# Patient Record
Sex: Female | Born: 1995
Health system: Southern US, Community
[De-identification: ages and names within clinical notes are randomized; demographics above are authoritative.]

## PROBLEM LIST (undated history)

## (undated) DIAGNOSIS — B009 Herpesviral infection, unspecified: Secondary | ICD-10-CM

## (undated) DIAGNOSIS — F411 Generalized anxiety disorder: Secondary | ICD-10-CM

## (undated) DIAGNOSIS — J45909 Unspecified asthma, uncomplicated: Secondary | ICD-10-CM

## (undated) DIAGNOSIS — D649 Anemia, unspecified: Secondary | ICD-10-CM

## (undated) DIAGNOSIS — A630 Anogenital (venereal) warts: Secondary | ICD-10-CM

## (undated) HISTORY — DX: Generalized anxiety disorder: F41.1

## (undated) HISTORY — DX: Anogenital (venereal) warts: A63.0

## (undated) HISTORY — DX: Herpesviral infection, unspecified: B00.9

## (undated) HISTORY — DX: Unspecified asthma, uncomplicated: J45.909

## (undated) HISTORY — DX: Anemia, unspecified: D64.9

---
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
## 2014-01-07 ENCOUNTER — Telehealth (INDEPENDENT_AMBULATORY_CARE_PROVIDER_SITE_OTHER): Payer: Self-pay | Admitting: General Surgery

## 2014-01-07 NOTE — Telephone Encounter (Signed)
I called to discuss her pathology report with her. There was no answer but I was able to leave a message only and straight machine to give me a call tomorrow to discuss this. I will try to contact her tomorrow as well.  Mallory MouldHaywood M. Derrell LollingIngram, M.D., [facility] General and Minimally invasive Surgery Breast and Colorectal Surgery Office:   [phone] Pager:   [phone]

## 2014-01-08 ENCOUNTER — Telehealth (INDEPENDENT_AMBULATORY_CARE_PROVIDER_SITE_OTHER): Payer: Self-pay

## 2014-01-08 ENCOUNTER — Telehealth (INDEPENDENT_AMBULATORY_CARE_PROVIDER_SITE_OTHER): Payer: Self-pay | Admitting: General Surgery

## 2014-01-08 NOTE — Telephone Encounter (Signed)
I have discussed her pathology report with Dr. Laureen OchsSmir  . He has done extensive evaluation for lymphoma on her right cervical lymph node. He does not think that she has lymphoma. 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 He says there is a small chance this could be a manifestation of systemic lupus, but doesn't favor that because of the absence of several features of the lymph node. Non-Hodgkin's lymphoma was considered, but he states that there is no evidence for that.He states that if this recurs more tissue should be obtained for further evaluation.    I discussed these findings with the patient. She seemed appreciative of the information. I will discuss this with her further at her next office visit next week. I offered a second opinion.   Angelia MouldHaywood M. Derrell LollingIngram, M.D., [facility] General and Minimally invasive Surgery Breast and Colorectal Surgery Office:   [phone] Pager:   [phone]

## 2014-01-08 NOTE — Telephone Encounter (Signed)
Pt mother called returning Dr Derrell LollingIngram phone call last night. Advised I will send a note asking Dr Derrell LollingIngram to call them back sometime today with path results. Pt mother states she has class and may not be able to answer phone. She requests that if Dr Derrell LollingIngram cannot get ahold of her if he could please document in chart what the results are so triage can relay the results. Request given to Dr Derrell LollingIngram.

## 2014-01-23 ENCOUNTER — Encounter (INDEPENDENT_AMBULATORY_CARE_PROVIDER_SITE_OTHER): Payer: Self-pay | Admitting: General Surgery

## 2014-01-23 ENCOUNTER — Ambulatory Visit (INDEPENDENT_AMBULATORY_CARE_PROVIDER_SITE_OTHER): Payer: Medicaid Other | Admitting: General Surgery

## 2014-01-23 VITALS — BP 116/80 | HR 80 | Temp 97.8°F | Resp 16 | Ht 64.0 in | Wt 138.0 lb

## 2014-01-23 DIAGNOSIS — R590 Localized enlarged lymph nodes: Secondary | ICD-10-CM

## 2014-01-23 DIAGNOSIS — R599 Enlarged lymph nodes, unspecified: Secondary | ICD-10-CM

## 2014-01-23 NOTE — Patient Instructions (Signed)
Your wound looks good and your healing from your right neck lymph node biopsy without any complications.  We have reviewed your pathology report and I gave you a copy. There is no evidence of cancer. There is no evidence of lymphoma. We feel that this is a benign lymphocytic proliferation. The pathologist and I do not think anything further needs to be done.  If the lymph nodes grow back, then consideration should be given to repeat biopsy.  Let  Dr. Derrell LollingIngram know if you would like to be referred to a medical oncologist for a second opinion.  Otherwise return to see Dr. Derrell LollingIngram if necessary.

## 2014-01-23 NOTE — Progress Notes (Signed)
Patient ID: Mallory Freeman, female   DOB: [birth year], 18 y.o.   MRN: [id number] History: This young woman underwent excision of a right cervical lymph node, posterior triangle, on 12/31/2013 because it remained persistent. She had a few other smaller nodes but this was the largest lymph node. She has recovered from this without any problems. No neck or shoulder pain. No muscle weakness. Dr. Laureen OchsSmir performed an extensive lymphoma workup on this lymph node. 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 He says there is a small chance this could be a manifestation of systemic lupus, but he doesn't favor that because of the absence of several features of the lymph nodes. Non-Hodgkin's lymphoma was considered, but he stated there was no evidence for that on special stains or flow cytometry. He stated if this recurred, more tissue should be obtained. I discussed this with her and gave her and her mother a copy of the pathology report  Exam:  healthy young woman. No distress Right neck incision healing normally. No muscle weakness.  Assessment  right cervical lymphadenopathy, final histology consistent with benign lymphocytic proliferation perforation and because she's disease  Plan: I gave her a copy of the pathology report, discussed the differential diagnosis and offered a second opinion with the medical oncologist. They will think about this Consider further biopsy of tissue if lymph nodes enlarge further Otherwise nothing further needs to be done.   Angelia MouldHaywood M. Derrell LollingIngram, M.D., [facility] General and Minimally invasive Surgery Breast and Colorectal Surgery Office:   [phone] Pager:   [phone]

## 2014-01-28 ENCOUNTER — Telehealth (INDEPENDENT_AMBULATORY_CARE_PROVIDER_SITE_OTHER): Payer: Self-pay | Admitting: General Surgery

## 2014-01-28 NOTE — Telephone Encounter (Signed)
Pt. Called and states the lymph nodes seem some larger and having problem turning neck. She was seen on last Thursday by  Dr. Derrell LollingIngram.  No fever no n/v . Is not on  anything for pain.She states surgery was done for Lymph node removal on 03/16 .I advised her to try anti inflammatory and I would notify nurse and dr Derrell LollingIngram of her concern. I tried to reassure her that it takes a while for the nodes to go down.Please call her if he wants to see her soon.Mallory Freeman 04/13

## 2014-01-29 NOTE — Telephone Encounter (Signed)
Dr. Derrell LollingIngram agrees with nurses' assessment.  Pt does not need to be seen until her post op appointment in 6-8 weeks.  Pt instructed to take her pain medication if needed, or OTC Ibuprofen.  Pt understood and agreed.

## 2015-03-20 IMAGING — CT CT NECK W/ CM
4 of 5 series · 16 of 33 positions shown, 19 images · non-contrast
Comparison: none

[Series 2: axial neck · axial · 0.38mm/px · z∈[+54,+162]mm · 3 of 109 slices shown]
[im 22/109  bone]
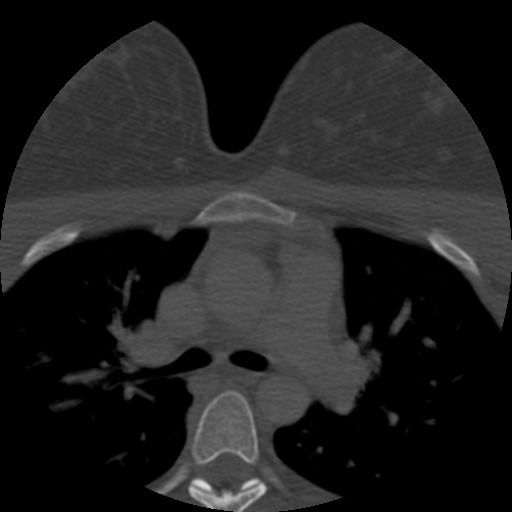
[im 44/109  bone]
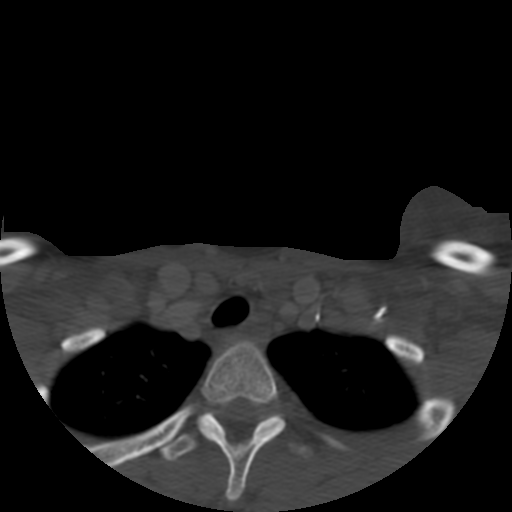
[im 65/109  bone]
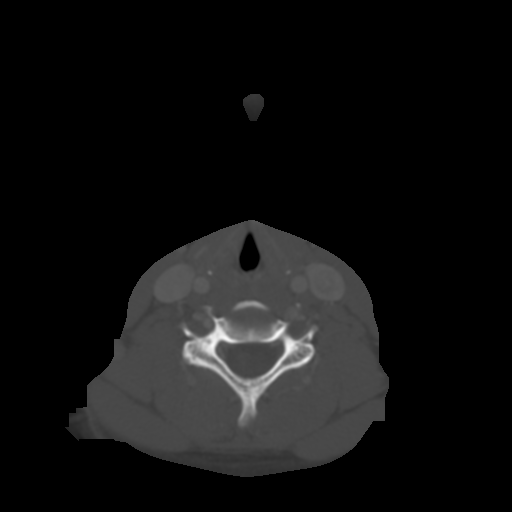

[Series 400: cor · coronal · 0.54mm/px · 3 of 106 slices shown]
[im 23/106  bone]
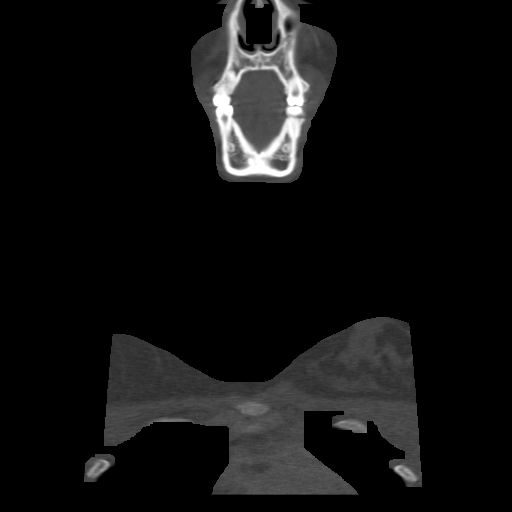
[im 43/106  bone]
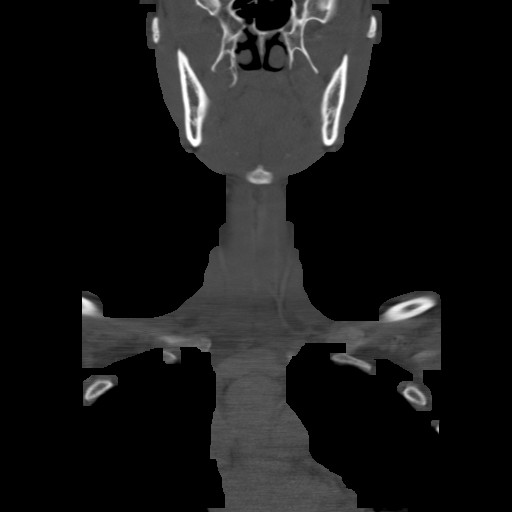
[im 63/106  bone]
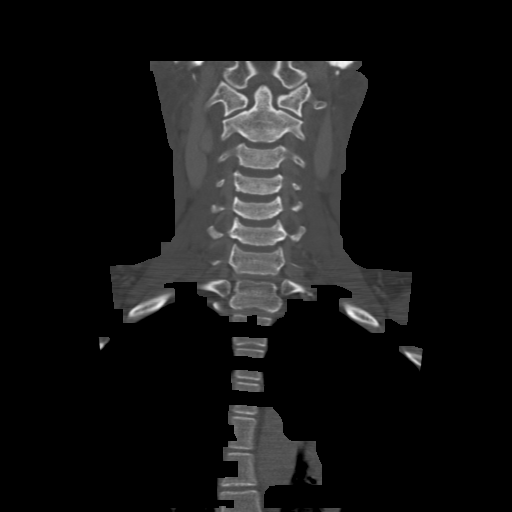

[Series 401: sag · sagittal · 0.54mm/px · 5 of 99 slices shown, 6 images]
[im 33/99  bone]
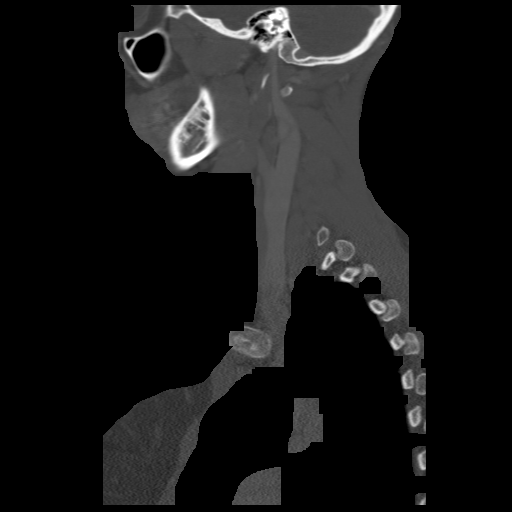
[im 41/99  bone]
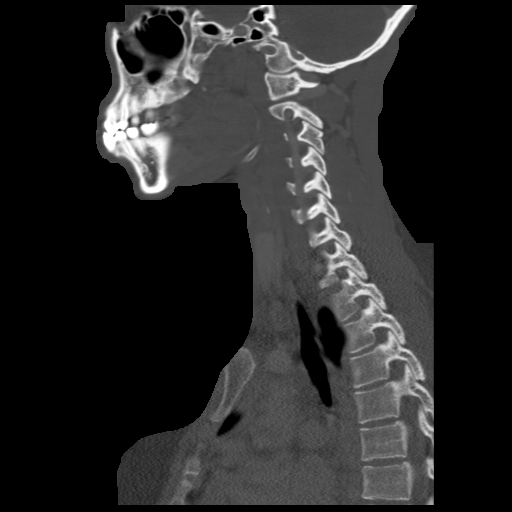
[im 50/99  soft-tissue]
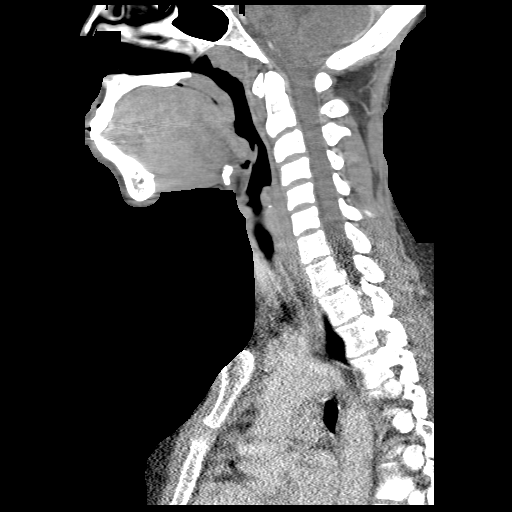
[im 50/99  bone]
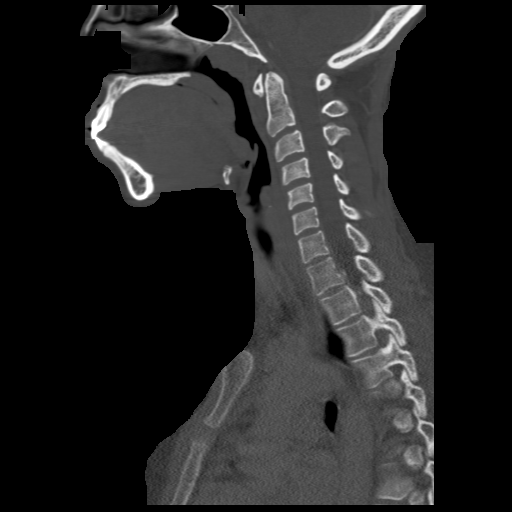
[im 58/99  bone]
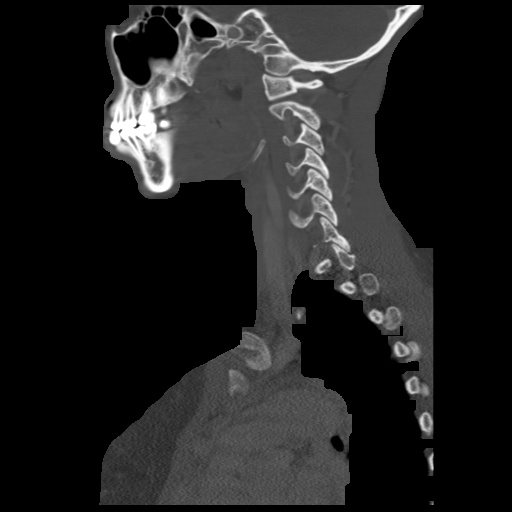
[im 66/99  bone]
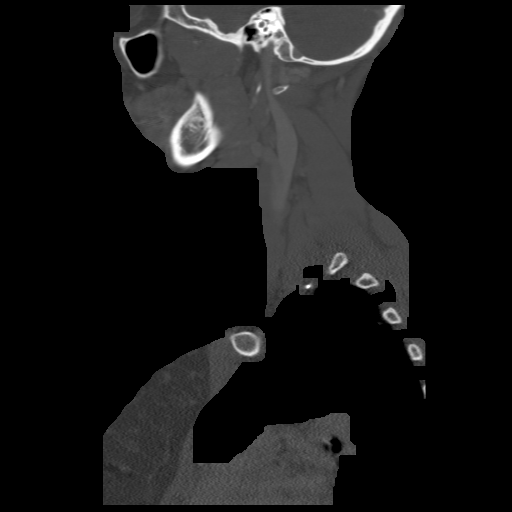

[Series 402: angled axial · axial · 0.37mm/px · z∈[+27,+208]mm · 5 of 143 slices shown, 7 images]
[im 24/143  soft-tissue]
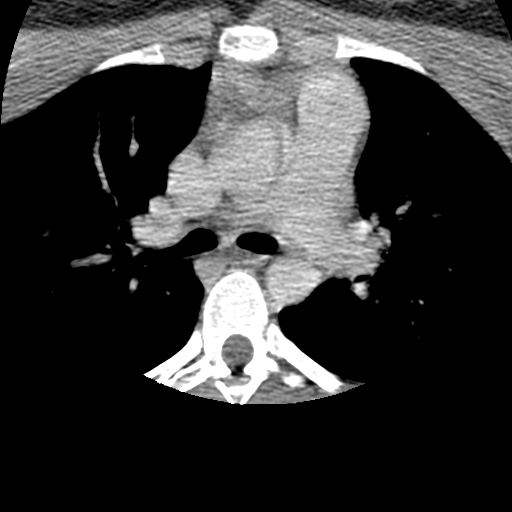
[im 24/143  bone]
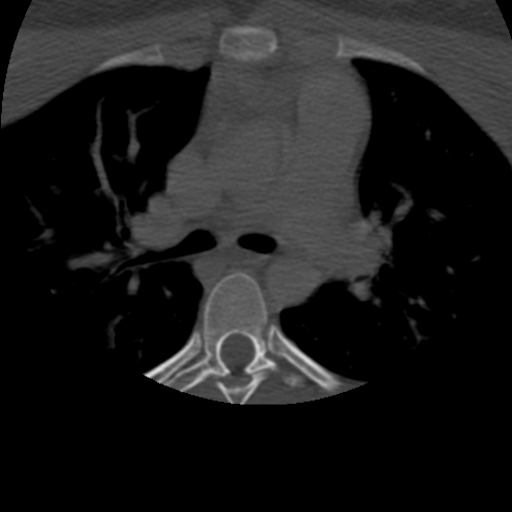
[im 48/143  bone]
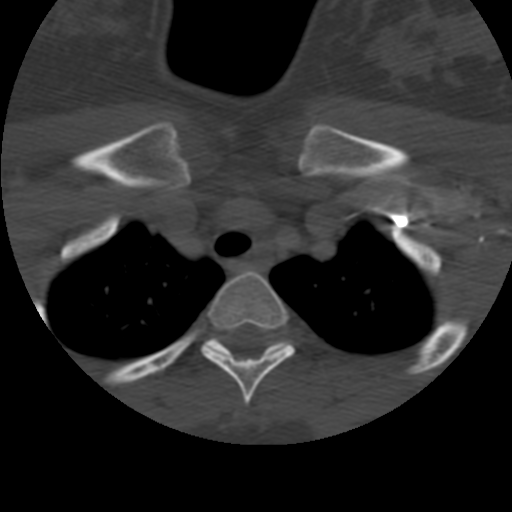
[im 72/143  bone]
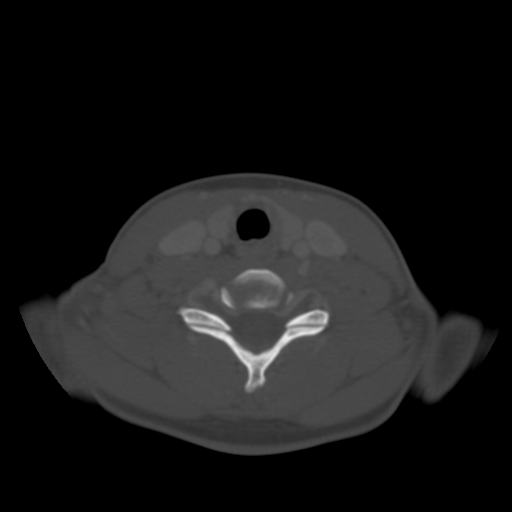
[im 95/143  bone]
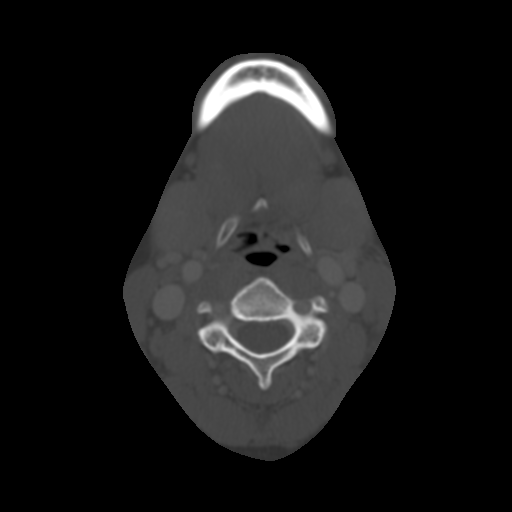
[im 119/143  soft-tissue]
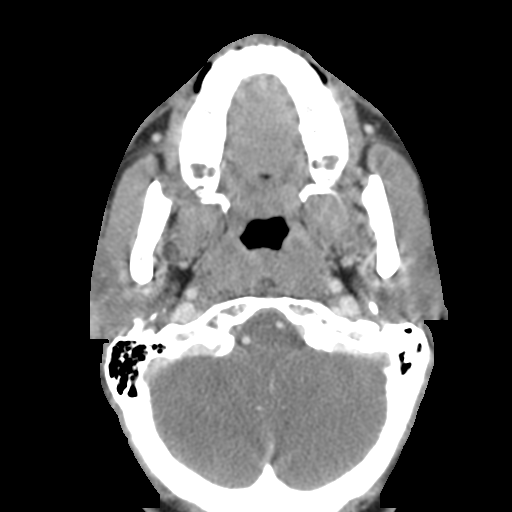
[im 119/143  bone]
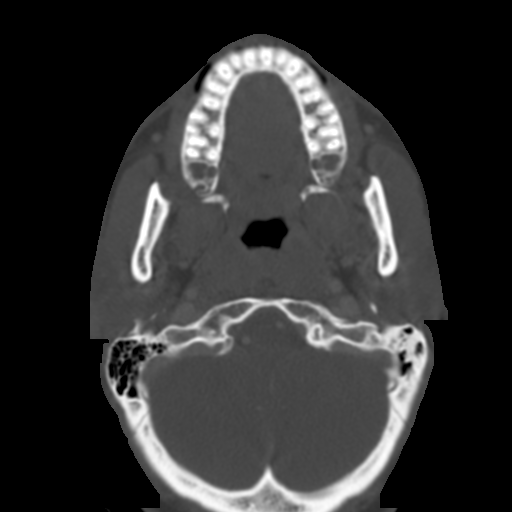

[16 of 33 positions shown; findings below may reference images not displayed]

CLINICAL DATA
Lymphadenopathy, preop.  Low-grade fever.  Symptoms for 1 month.

EXAM
CT NECK WITH CONTRAST

TECHNIQUE
Multidetector CT imaging of the neck was performed using the
standard protocol following the bolus administration of intravenous
contrast.

CONTRAST
75mL OMNIPAQUE IOHEXOL 300 MG/ML  SOLN

COMPARISON
None.

FINDINGS
The area of clinical concern is marked with a vitamin E capsule.

There is a 9 mm short axis right level VB lymph node corresponding
to the area of palpable abnormality. Right greater than left level
IIB, level IIA, and right level IV lymph nodes are also seen, none
of which exceed 10 mm short axis dimension.

Normal thyroid. No mucosal lesion. No evidence for tonsillitis or
peritonsillar inflammation. Normal major and minor salivary glands.
Unremarkable osseous structures. No upper mediastinal adenopathy is
evident. Normal lung apices. Visualized intracranial compartment
unremarkable.

IMPRESSION
The area of clinical concern corresponds to a right level VB lymph
node 9 mm short axis. See discussion above.

SIGNATURE

## 2016-02-11 DIAGNOSIS — N760 Acute vaginitis: Secondary | ICD-10-CM | POA: Diagnosis not present

## 2016-02-11 DIAGNOSIS — B373 Candidiasis of vulva and vagina: Secondary | ICD-10-CM | POA: Diagnosis not present

## 2016-02-11 DIAGNOSIS — L293 Anogenital pruritus, unspecified: Secondary | ICD-10-CM | POA: Diagnosis not present

## 2016-02-11 DIAGNOSIS — A630 Anogenital (venereal) warts: Secondary | ICD-10-CM | POA: Diagnosis not present

## 2016-02-17 DIAGNOSIS — A630 Anogenital (venereal) warts: Secondary | ICD-10-CM | POA: Diagnosis not present

## 2016-02-24 DIAGNOSIS — A630 Anogenital (venereal) warts: Secondary | ICD-10-CM | POA: Diagnosis not present

## 2016-03-08 ENCOUNTER — Emergency Department (HOSPITAL_COMMUNITY)
Admission: EM | Admit: 2016-03-08 | Discharge: 2016-03-08 | Disposition: A | Discharge status: Home / Self Care | Payer: 59 | Attending: Emergency Medicine | Admitting: Emergency Medicine

## 2016-03-08 DIAGNOSIS — J45909 Unspecified asthma, uncomplicated: Secondary | ICD-10-CM | POA: Diagnosis not present

## 2016-03-08 DIAGNOSIS — F329 Major depressive disorder, single episode, unspecified: Secondary | ICD-10-CM | POA: Insufficient documentation

## 2016-03-08 DIAGNOSIS — F10230 Alcohol dependence with withdrawal, uncomplicated: Secondary | ICD-10-CM | POA: Diagnosis not present

## 2016-03-08 DIAGNOSIS — Y903 Blood alcohol level of 60-79 mg/100 ml: Secondary | ICD-10-CM | POA: Diagnosis not present

## 2016-03-08 DIAGNOSIS — F32A Depression, unspecified: Secondary | ICD-10-CM

## 2016-03-08 DIAGNOSIS — F1024 Alcohol dependence with alcohol-induced mood disorder: Secondary | ICD-10-CM | POA: Diagnosis not present

## 2016-03-08 DIAGNOSIS — F1094 Alcohol use, unspecified with alcohol-induced mood disorder: Secondary | ICD-10-CM

## 2016-03-08 DIAGNOSIS — R45851 Suicidal ideations: Secondary | ICD-10-CM | POA: Diagnosis present

## 2016-03-08 LAB — RAPID URINE DRUG SCREEN, HOSP PERFORMED
Amphetamines: NOT DETECTED
BARBITURATES: NOT DETECTED
BENZODIAZEPINES: NOT DETECTED
COCAINE: NOT DETECTED
OPIATES: NOT DETECTED
Tetrahydrocannabinol: POSITIVE — AB

## 2016-03-08 LAB — CBC
HEMATOCRIT: 35.4 % — AB (ref 36.0–46.0)
Hemoglobin: 12.6 g/dL (ref 12.0–15.0)
MCH: 32.8 pg (ref 26.0–34.0)
MCHC: 35.6 g/dL (ref 30.0–36.0)
MCV: 92.2 fL (ref 78.0–100.0)
Platelets: 212 10*3/uL (ref 150–400)
RBC: 3.84 MIL/uL — ABNORMAL LOW (ref 3.87–5.11)
RDW: 11.8 % (ref 11.5–15.5)
WBC: 11.3 10*3/uL — AB (ref 4.0–10.5)

## 2016-03-08 LAB — COMPREHENSIVE METABOLIC PANEL
ALBUMIN: 4.5 g/dL (ref 3.5–5.0)
ALT: 25 U/L (ref 14–54)
ANION GAP: 10 (ref 5–15)
AST: 29 U/L (ref 15–41)
Alkaline Phosphatase: 41 U/L (ref 38–126)
BUN: 11 mg/dL (ref 6–20)
CO2: 21 mmol/L — AB (ref 22–32)
Calcium: 9.2 mg/dL (ref 8.9–10.3)
Chloride: 110 mmol/L (ref 101–111)
Creatinine, Ser: 0.84 mg/dL (ref 0.44–1.00)
GFR calc non Af Amer: >60 mL/min (ref >60)
GLUCOSE: 90 mg/dL (ref 65–99)
POTASSIUM: 3.5 mmol/L (ref 3.5–5.1)
SODIUM: 141 mmol/L (ref 135–145)
TOTAL PROTEIN: 7.9 g/dL (ref 6.5–8.1)
Total Bilirubin: 0.7 mg/dL (ref 0.3–1.2)

## 2016-03-08 LAB — ACETAMINOPHEN LEVEL

## 2016-03-08 LAB — ETHANOL: Alcohol, Ethyl (B): 62 mg/dL — ABNORMAL HIGH (ref <5)

## 2016-03-08 LAB — SALICYLATE LEVEL

## 2016-03-08 MED ORDER — ONDANSETRON HCL 4 MG PO TABS: 4.0000 mg | ORAL_TABLET | Freq: Three times a day (TID) | ORAL | Status: DC | PRN

## 2016-03-08 MED ORDER — ACETAMINOPHEN 325 MG PO TABS: 650.0000 mg | ORAL_TABLET | ORAL | Status: DC | PRN

## 2016-03-08 MED ORDER — LORAZEPAM 1 MG PO TABS: 1.0000 mg | ORAL_TABLET | Freq: Three times a day (TID) | ORAL | Status: DC | PRN

## 2016-03-08 NOTE — ED Notes (Signed)
Psychiatry at bedside.
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
## 2016-03-08 NOTE — ED Notes (Signed)
Bed: [facility] Expected date:  Expected time:  Means of arrival:  Comments: [employer]-IVC 
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
## 2016-03-08 NOTE — Discharge Instructions (Signed)
For your ongoing behavioral health needs, you are advised to follow up with one of the following practices for outpatient psychiatry and therapy.  Contact them at your earliest opportunity to schedule an intake appointment:       [facility]      [address]      [place], [place] [address]      [phone]       [facility]      [facility]      [address]      [place], [place] [address]      [phone]       [facility]      [address]      [place], [place] [address]      [phone]

## 2016-03-08 NOTE — BHH Suicide Risk Assessment (Signed)
Suicide Risk Assessment  Discharge Assessment   [facility]   Principal Problem: Alcohol-induced mood disorder [facility](HCC) Discharge Diagnoses:  Patient Active Problem List   Diagnosis Date Noted  . Alcohol dependence with withdrawal, uncomplicated (HCC) [F10.230] 03/08/2016    Priority: High  . Alcohol-induced mood disorder (HCC) [F10.94] 03/08/2016    Priority: High  . Lymphadenopathy of right cervical region [R59.0] 11/30/2013  . Anemia [D64.9] 09/25/2012    Total Time spent with patient: 45 minutes   Musculoskeletal: Strength & Muscle Tone: within normal limits Gait & Station: normal Patient leans: N/A  Psychiatric Specialty Exam: Physical Exam  Constitutional: She is oriented to person, place, and time. She appears well-developed and well-nourished.  HENT:  Head: Normocephalic.  Neck: Normal range of motion.  Respiratory: Effort normal.  Musculoskeletal: Normal range of motion.  Neurological: She is alert and oriented to person, place, and time.  Skin: Skin is warm and dry.    Review of Systems  Constitutional: Negative.   HENT: Negative.   Eyes: Negative.   Respiratory: Negative.   Cardiovascular: Negative.   Gastrointestinal: Negative.   Genitourinary: Negative.   Musculoskeletal: Negative.   Skin: Negative.   Neurological: Negative.   Endo/Heme/Allergies: Negative.   Psychiatric/Behavioral: Positive for substance abuse.    Blood pressure 102/71, pulse 88, temperature 98.7 F (37.1 C), temperature source Oral, resp. rate 16, height 5\' 4"  (1.626 m), weight 58.968 kg (130 lb), last menstrual period 02/07/2016, SpO2 100 %.Body mass index is 22.3 kg/(m^2).  General Appearance: Casual  Eye Contact:  Good  Speech:  Normal Rate  Volume:  Normal  Mood:  Depressed, mild  Affect:  Congruent  Thought Process:  Coherent  Orientation:  Full (Time, Place, and Person)  Thought Content:  WDL  Suicidal Thoughts:  No  Homicidal Thoughts:  No   Memory:  Immediate;   Good Recent;   Good Remote;   Good  Judgement:  Fair  Insight:  Fair  Psychomotor Activity:  Normal  Concentration:  Concentration: Good and Attention Span: Good  Recall:  Good  Fund of Knowledge:  Good  Language:  Good  Akathisia:  No  Handed:  Right  AIMS (if indicated):     Assets:  Communication Skills Desire for Improvement Financial Resources/Insurance Housing Leisure Time Physical Health Resilience Social Support Transportation Vocational/Educational  ADL's:  Intact  Cognition:  WNL  Sleep:       Mental Status Per Nursing Assessment::   On Admission:   alcohol intoxication with suicide remarks  Demographic Factors:  Adolescent or young adult  Loss Factors: NA  Historical Factors: NA  Risk Reduction Factors:   Sense of responsibility to family, Living with another person, especially a relative, Positive social support and Positive coping skills or problem solving skills  Continued Clinical Symptoms:  Depression, mild  Cognitive Features That Contribute To Risk:  None    Suicide Risk:  Minimal: No identifiable suicidal ideation.  Patients presenting with no risk factors but with morbid ruminations; may be classified as minimal risk based on the severity of the depressive symptoms    Plan Of Care/Follow-up recommendations:  Activity:  as tolerated Diet:  heart healthy diet  LORD, JAMISON, NP 03/08/2016, 1:08 PM
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
## 2016-03-08 NOTE — ED Notes (Signed)
Adolphus BirchwoodShanell Abate- mom [phone]
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
## 2016-03-10 DIAGNOSIS — H5202 Hypermetropia, left eye: Secondary | ICD-10-CM | POA: Diagnosis not present

## 2016-05-16 DIAGNOSIS — L049 Acute lymphadenitis, unspecified: Secondary | ICD-10-CM | POA: Diagnosis not present

## 2016-05-19 DIAGNOSIS — R221 Localized swelling, mass and lump, neck: Secondary | ICD-10-CM | POA: Diagnosis not present

## 2016-06-07 DIAGNOSIS — B373 Candidiasis of vulva and vagina: Secondary | ICD-10-CM | POA: Diagnosis not present

## 2016-06-07 DIAGNOSIS — N898 Other specified noninflammatory disorders of vagina: Secondary | ICD-10-CM | POA: Diagnosis not present

## 2016-06-08 DIAGNOSIS — R591 Generalized enlarged lymph nodes: Secondary | ICD-10-CM | POA: Diagnosis not present

## 2016-08-04 DIAGNOSIS — N760 Acute vaginitis: Secondary | ICD-10-CM | POA: Diagnosis not present

## 2016-08-04 DIAGNOSIS — Z202 Contact with and (suspected) exposure to infections with a predominantly sexual mode of transmission: Secondary | ICD-10-CM | POA: Diagnosis not present

## 2016-08-04 DIAGNOSIS — Z113 Encounter for screening for infections with a predominantly sexual mode of transmission: Secondary | ICD-10-CM | POA: Diagnosis not present

## 2016-08-04 DIAGNOSIS — N898 Other specified noninflammatory disorders of vagina: Secondary | ICD-10-CM | POA: Diagnosis not present

## 2016-08-05 DIAGNOSIS — Z113 Encounter for screening for infections with a predominantly sexual mode of transmission: Secondary | ICD-10-CM | POA: Diagnosis not present

## 2016-10-08 DIAGNOSIS — Z113 Encounter for screening for infections with a predominantly sexual mode of transmission: Secondary | ICD-10-CM | POA: Diagnosis not present

## 2016-10-21 DIAGNOSIS — Z1151 Encounter for screening for human papillomavirus (HPV): Secondary | ICD-10-CM | POA: Diagnosis not present

## 2016-10-21 DIAGNOSIS — Z3046 Encounter for surveillance of implantable subdermal contraceptive: Secondary | ICD-10-CM | POA: Diagnosis not present

## 2016-10-21 DIAGNOSIS — Z124 Encounter for screening for malignant neoplasm of cervix: Secondary | ICD-10-CM | POA: Diagnosis not present

## 2016-10-21 DIAGNOSIS — Z6822 Body mass index (BMI) 22.0-22.9, adult: Secondary | ICD-10-CM | POA: Diagnosis not present

## 2016-10-21 DIAGNOSIS — Z130 Encounter for screening for diseases of the blood and blood-forming organs and certain disorders involving the immune mechanism: Secondary | ICD-10-CM | POA: Diagnosis not present

## 2016-10-21 DIAGNOSIS — N921 Excessive and frequent menstruation with irregular cycle: Secondary | ICD-10-CM | POA: Diagnosis not present

## 2016-10-21 DIAGNOSIS — Z01419 Encounter for gynecological examination (general) (routine) without abnormal findings: Secondary | ICD-10-CM | POA: Diagnosis not present

## 2016-10-21 DIAGNOSIS — Z1389 Encounter for screening for other disorder: Secondary | ICD-10-CM | POA: Diagnosis not present

## 2016-10-21 DIAGNOSIS — R87610 Atypical squamous cells of undetermined significance on cytologic smear of cervix (ASC-US): Secondary | ICD-10-CM | POA: Diagnosis not present

## 2016-10-21 LAB — CBC AND DIFFERENTIAL: Hemoglobin: 13 (ref 12.0–16.0)

## 2016-11-05 ENCOUNTER — Ambulatory Visit (INDEPENDENT_AMBULATORY_CARE_PROVIDER_SITE_OTHER): Payer: 59 | Admitting: Urgent Care

## 2016-11-05 VITALS — BP 112/72 | HR 113 | Temp 98.3°F | Resp 18 | Ht 64.0 in | Wt 129.0 lb

## 2016-11-05 DIAGNOSIS — R05 Cough: Secondary | ICD-10-CM

## 2016-11-05 DIAGNOSIS — J069 Acute upper respiratory infection, unspecified: Secondary | ICD-10-CM

## 2016-11-05 DIAGNOSIS — R52 Pain, unspecified: Secondary | ICD-10-CM

## 2016-11-05 DIAGNOSIS — B9789 Other viral agents as the cause of diseases classified elsewhere: Secondary | ICD-10-CM

## 2016-11-05 DIAGNOSIS — J029 Acute pharyngitis, unspecified: Secondary | ICD-10-CM | POA: Diagnosis not present

## 2016-11-05 DIAGNOSIS — R059 Cough, unspecified: Secondary | ICD-10-CM

## 2016-11-05 LAB — POCT INFLUENZA A/B
Influenza A, POC: NEGATIVE
Influenza B, POC: NEGATIVE

## 2016-11-05 LAB — POCT RAPID STREP A (OFFICE): Rapid Strep A Screen: NEGATIVE

## 2016-11-05 MED ORDER — CETIRIZINE HCL 10 MG PO TABS
10.0000 mg | ORAL_TABLET | Freq: Every day | ORAL | 11 refills | Status: DC
Start: 2016-11-05 — End: 2017-03-24

## 2016-11-05 MED ORDER — BENZONATATE 100 MG PO CAPS: 100.0000 mg | ORAL_CAPSULE | Freq: Three times a day (TID) | ORAL | 0 refills | Status: DC | PRN

## 2016-11-05 MED ORDER — PSEUDOEPHEDRINE HCL ER 120 MG PO TB12: 120.0000 mg | ORAL_TABLET | Freq: Two times a day (BID) | ORAL | 3 refills | Status: DC

## 2016-11-05 MED ORDER — HYDROCODONE-HOMATROPINE 5-1.5 MG/5ML PO SYRP: 5.0000 mL | ORAL_SOLUTION | Freq: Every evening | ORAL | 0 refills | Status: DC | PRN

## 2016-11-05 NOTE — Patient Instructions (Signed)
Upper Respiratory Infection, Adult Most upper respiratory infections (URIs) are a viral infection of the air passages leading to the lungs. A URI affects the nose, throat, and upper air passages. The most common type of URI is nasopharyngitis and is typically referred to as "the common cold." URIs run their course and usually go away on their own. Most of the time, a URI does not require medical attention, but sometimes a bacterial infection in the upper airways can follow a viral infection. This is called a secondary infection. Sinus and middle ear infections are common types of secondary upper respiratory infections. Bacterial pneumonia can also complicate a URI. A URI can worsen asthma and chronic obstructive pulmonary disease (COPD). Sometimes, these complications can require emergency medical care and may be life threatening. What are the causes? Almost all URIs are caused by viruses. A virus is a type of germ and can spread from one person to another. What increases the risk? You may be at risk for a URI if:  You smoke.  You have chronic heart or lung disease.  You have a weakened defense (immune) system.  You are very young or very old.  You have nasal allergies or asthma.  You work in crowded or poorly ventilated areas.  You work in health care facilities or schools.  What are the signs or symptoms? Symptoms typically develop 2-3 days after you come in contact with a cold virus. Most viral URIs last 7-10 days. However, viral URIs from the influenza virus (flu virus) can last 14-18 days and are typically more severe. Symptoms may include:  Runny or stuffy (congested) nose.  Sneezing.  Cough.  Sore throat.  Headache.  Fatigue.  Fever.  Loss of appetite.  Pain in your forehead, behind your eyes, and over your cheekbones (sinus pain).  Muscle aches.  How is this diagnosed? Your health care provider may diagnose a URI by:  Physical exam.  Tests to check that your  symptoms are not due to another condition such as: ? Strep throat. ? Sinusitis. ? Pneumonia. ? Asthma.  How is this treated? A URI goes away on its own with time. It cannot be cured with medicines, but medicines may be prescribed or recommended to relieve symptoms. Medicines may help:  Reduce your fever.  Reduce your cough.  Relieve nasal congestion.  Follow these instructions at home:  Take medicines only as directed by your health care provider.  Gargle warm saltwater or take cough drops to comfort your throat as directed by your health care provider.  Use a warm mist humidifier or inhale steam from a shower to increase air moisture. This may make it easier to breathe.  Drink enough fluid to keep your urine clear or pale yellow.  Eat soups and other clear broths and maintain good nutrition.  Rest as needed.  Return to work when your temperature has returned to normal or as your health care provider advises. You may need to stay home longer to avoid infecting others. You can also use a face mask and careful hand washing to prevent spread of the virus.  Increase the usage of your inhaler if you have asthma.  Do not use any tobacco products, including cigarettes, chewing tobacco, or electronic cigarettes. If you need help quitting, ask your health care provider. How is this prevented? The best way to protect yourself from getting a cold is to practice good hygiene.  Avoid oral or hand contact with people with cold symptoms.  Wash your   hands often if contact occurs.  There is no clear evidence that vitamin C, vitamin E, echinacea, or exercise reduces the chance of developing a cold. However, it is always recommended to get plenty of rest, exercise, and practice good nutrition. Contact a health care provider if:  You are getting worse rather than better.  Your symptoms are not controlled by medicine.  You have chills.  You have worsening shortness of breath.  You have  brown or red mucus.  You have yellow or brown nasal discharge.  You have pain in your face, especially when you bend forward.  You have a fever.  You have swollen neck glands.  You have pain while swallowing.  You have white areas in the back of your throat. Get help right away if:  You have severe or persistent: ? Headache. ? Ear pain. ? Sinus pain. ? Chest pain.  You have chronic lung disease and any of the following: ? Wheezing. ? Prolonged cough. ? Coughing up blood. ? A change in your usual mucus.  You have a stiff neck.  You have changes in your: ? Vision. ? Hearing. ? Thinking. ? Mood. This information is not intended to replace advice given to you by your health care provider. Make sure you discuss any questions you have with your health care provider. Document Released: 03/30/2001 Document Revised: 06/06/2016 Document Reviewed: 01/09/2014 Elsevier Interactive Patient Education  2017 Elsevier Inc.  

## 2016-11-05 NOTE — Progress Notes (Signed)
   MRN: [id number] DOB: [birth year]  Subjective:   Mallory Freeman is a 21 y.o. female presenting for chief complaint of Cough (x5 days; very bad cough); Headache (sinus area); Nasal Congestion; and Sore Throat  Reports 5 day history of headache, sore throat, nasal congestion, body aches, worsening dry cough with chest tightness. Has also had some nausea which she typically gets when feels ill. Has tried Mucinex, NyQuil, Alka-Seltzer with some relief. Denies fever, weakness, fatigue, weight loss, sinus pain, ear pain, eye pain, chest pain, wheezing, vomiting, abdominal pain, rashes. Denies smoking cigarettes. Admits history of allergies and asthma worsened by illness. Has been using albuterol inhaler.   Mallory Freeman has a current medication list which includes the following prescription(s): etonogestrel and albuterol. Also has No Known Allergies.  Mallory Freeman  has a past medical history of Anemia and Asthma. Also  has no past surgical history on file.  Her family history includes Breast cancer in her other; Cancer in her maternal grandfather and maternal grandmother; Diabetes in her maternal grandfather; Hypertension in her mother.   Objective:   Vitals: BP 112/72   Pulse (!) 113   Temp 98.3 F (36.8 C) (Oral)   Resp 18   Ht 5\' 4"  (1.626 m)   Wt 129 lb (58.5 kg)   LMP  (LMP Unknown)   SpO2 100%   BMI 22.14 kg/m   Physical Exam  Constitutional: She is oriented to person, place, and time. She appears well-developed and well-nourished.  HENT:  TM's intact bilaterally, no effusions or erythema. Nasal turbinates pink and moist, nasal passages patent. Frontal sinus tenderness. Oropharynx with moderate post-nasal drainage, mucous membranes moist, dentition in good repair.  Eyes: Right eye exhibits no discharge. Left eye exhibits no discharge.  Neck: Normal range of motion. Neck supple.  Cardiovascular: Normal rate, regular rhythm and intact distal pulses.  Exam reveals no gallop and no  friction rub.   No murmur heard. Pulmonary/Chest: No respiratory distress. She has no wheezes. She has no rales.  Lymphadenopathy:    She has no cervical adenopathy.  Neurological: She is alert and oriented to person, place, and time.  Skin: Skin is warm and dry.   Results for orders placed or performed in visit on 11/05/16 (from the past 24 hour(s))  POCT rapid strep A     Status: None   Collection Time: 11/05/16  4:23 PM  Result Value Ref Range   Rapid Strep A Screen Negative Negative  POCT Influenza A/B     Status: None   Collection Time: 11/05/16  4:25 PM  Result Value Ref Range   Influenza A, POC Negative Negative   Influenza B, POC Negative Negative   Assessment and Plan :   1. Viral URI with cough 2. Cough 3. Sore throat 4. Body aches - Likely viral in nature, advised supportive care. - If no improvement or symptoms do not resolve return to clinic in 3-5 days  Wallis BambergMario Akela Pocius, [place] JerseyPA-C [facility] [phone] 11/05/2016  4:08 PM

## 2016-11-05 NOTE — Addendum Note (Signed)
Addended by: Baldwin CrownJOHNSON, Nasir Bright D on: 11/05/2016 05:03 PM   Modules accepted: Orders

## 2016-11-07 LAB — CULTURE, GROUP A STREP: STREP A CULTURE: NEGATIVE

## 2016-11-08 ENCOUNTER — Encounter: Payer: Self-pay | Admitting: Urgent Care

## 2016-11-15 DIAGNOSIS — R809 Proteinuria, unspecified: Secondary | ICD-10-CM | POA: Diagnosis not present

## 2017-03-11 DIAGNOSIS — N898 Other specified noninflammatory disorders of vagina: Secondary | ICD-10-CM | POA: Diagnosis not present

## 2017-03-11 DIAGNOSIS — N760 Acute vaginitis: Secondary | ICD-10-CM | POA: Diagnosis not present

## 2017-03-11 DIAGNOSIS — R809 Proteinuria, unspecified: Secondary | ICD-10-CM | POA: Diagnosis not present

## 2017-03-11 DIAGNOSIS — A630 Anogenital (venereal) warts: Secondary | ICD-10-CM | POA: Diagnosis not present

## 2017-03-23 NOTE — Progress Notes (Signed)
HPI:  Mallory Freeman is here to establish care.  Last PCP and physical: sees gynecologist for gyn exams Mallory Freeman)  Has the following chronic problems that require follow up and concerns today:  GAD: -worries all the time about everything -reports hx dx of biplar d/o or and thoughts of self harm remotely, but none recently and this happened with specific triggering events -denies depression, thoughts of harm to self or others, safety concerns, panic, manic symptoms -she does smoke pot to help but wants to quit -drinks rarely - 2-3 drinks once 1-3 times per month -denies use of any other drugs   Protein in urine: -reports had random udip at gyn visit and was told had protein in urine and to see PCP -no malaise, hematuria, dysuria, excessive exercise, hx renal dz, FH renal dz, fevers  Mild intermittent asthma: -had asthma as a child -now only has symptoms sometimes with exercise and with bad respiratory illness -uses alb prn -no symptoms currently  Contraception/Hx genital herpes/genital warts: -seeing gyn for management   ROS negative for unless reported above: fevers, unintentional weight loss, hearing or vision loss, chest pain, palpitations, struggling to breath, hemoptysis, melena, hematochezia, hematuria, falls, loc, si, thoughts of self harm  Past Medical History:  Diagnosis Date  . Anemia   . Asthma   . GAD (generalized anxiety disorder)   . Genital warts   . HSV infection     No past surgical history on file.  Family History  Problem Relation Age of Onset  . Hypertension Mother   . Cancer Maternal Grandmother        lymphoma  . Cancer Maternal Grandfather        prostate  . Diabetes Maternal Grandfather   . Breast cancer Other        Maternal Great Aunts and GreatGreat GM    Social History   Social History  . Marital status: Single    Spouse name: N/A  . Number of children: N/A  . Years of education: N/A   Social History Main Topics   . Smoking status: Never Smoker  . Smokeless tobacco: Never Used  . Alcohol use No  . Drug use: No  . Sexual activity: No   Other Topics Concern  . 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  albuterol (PROVENTIL HFA;VENTOLIN HFA) 108 (90 BASE) MCG/ACT inhaler, Inhale 2 puffs into the lungs every 6 (six) hours as needed., Disp: , Rfl:  .  etonogestrel (NEXPLANON) 68 MG IMPL implant, 1 each by Subdermal route once., Disp: , Rfl:   EXAM:  Vitals:   03/24/17 1306  BP: 100/80  Pulse: 66  Temp: 98.8 F (37.1 C)    Body mass index is 21.87 kg/m.  GENERAL: vitals reviewed and listed above, alert, oriented, appears well hydrated and in no acute distress  HEENT: atraumatic, conjunttiva clear, no obvious abnormalities on inspection of external nose and ears  NECK: no obvious masses on inspection  LUNGS: clear to auscultation bilaterally, no wheezes, rales or rhonchi, good air movement  CV: HRRR, no peripheral edema  MS: moves all extremities without noticeable abnormality  PSYCH: pleasant and cooperative, no obvious depression or anxiety  ASSESSMENT AND PLAN:  Discussed the following assessment  and plan:  Abnormal urine - Plan: Basic metabolic panel, POCT Urinalysis Dipstick (Automated)  GAD (generalized anxiety disorder)  HSV infection  Genital warts  Alcohol use  Encounter to establish care -We reviewed the PMH, PSH, FH, SH, Meds and Allergies. -We provided refills for any medications we will prescribe as needed. -We addressed current concerns per orders and patient instructions. -We have asked for records for pertinent exams, studies, vaccines and notes from previous providers. -We have advised patient to follow up per instructions below. -advise to of risks of weed and alcohol and offered  help -advised treatment for GAD and she agrees to consider CBT - return and emergency precautions discussed -labs per orders  -Patient advised to return or notify a doctor immediately if symptoms worsen or persist or new concerns arise.  Patient Instructions  BEFORE YOU LEAVE: -follow up: 3 months -lab for urine studies  We have ordered labs or studies at this visit. It can take up to 1-2 weeks for results and processing. IF results require follow up or explanation, we will call you with instructions. Clinically stable results will be released to your [facility]. If you have not heard from us or cannot find your results in [facility] in 2 weeks please contact our office at [phone].  If you are not yet signed up for [facility], please consider signing up.   Please call to schedule appointment with behavioral medicine regarding the anxiety. Follow up if worsening or new concerns.   We recommend the following healthy lifestyle for LIFE: 1) Small portions.   Tip: eat off of a salad plate instead of a dinner plate.  Tip: if you need more or a snack choose fruits, veggies and/or a handful of nuts or seeds.  2) Eat a healthy clean diet.  * Tip: Avoid (less then 1 serving per week): processed foods, sweets, sweetened drinks, white starches (rice, flour, bread, potatoes, pasta, etc), red meat, fast foods, butter  *Tip: CHOOSE instead   * 5-9 servings per day of fresh or frozen fruits and vegetables (but not corn, potatoes, bananas, canned or dried fruit)   *nuts and seeds, beans   *olives and olive oil   *small portions of lean meats such as fish and white chicken    *small portions of whole grains  3)Get at least 150 minutes of sweaty aerobic exercise per week.  4)Reduce stress - consider counseling, meditation and relaxation to balance other aspects of your life.         Kriste BasqueKIM, Nikko Goldwire R.

## 2017-03-24 ENCOUNTER — Ambulatory Visit (INDEPENDENT_AMBULATORY_CARE_PROVIDER_SITE_OTHER): Payer: 59 | Admitting: Family Medicine

## 2017-03-24 ENCOUNTER — Encounter: Payer: Self-pay | Admitting: Family Medicine

## 2017-03-24 VITALS — BP 100/80 | HR 66 | Temp 98.8°F | Ht 64.75 in | Wt 130.4 lb

## 2017-03-24 DIAGNOSIS — A630 Anogenital (venereal) warts: Secondary | ICD-10-CM | POA: Insufficient documentation

## 2017-03-24 DIAGNOSIS — Z7689 Persons encountering health services in other specified circumstances: Secondary | ICD-10-CM | POA: Diagnosis not present

## 2017-03-24 DIAGNOSIS — Z789 Other specified health status: Secondary | ICD-10-CM | POA: Insufficient documentation

## 2017-03-24 DIAGNOSIS — R8290 Unspecified abnormal findings in urine: Secondary | ICD-10-CM

## 2017-03-24 DIAGNOSIS — F411 Generalized anxiety disorder: Secondary | ICD-10-CM | POA: Diagnosis not present

## 2017-03-24 DIAGNOSIS — B009 Herpesviral infection, unspecified: Secondary | ICD-10-CM | POA: Insufficient documentation

## 2017-03-24 DIAGNOSIS — Z7289 Other problems related to lifestyle: Secondary | ICD-10-CM

## 2017-03-24 LAB — BASIC METABOLIC PANEL
BUN: 9 mg/dL (ref 6–23)
CHLORIDE: 105 meq/L (ref 96–112)
CO2: 25 mEq/L (ref 19–32)
Calcium: 10.5 mg/dL (ref 8.4–10.5)
Creatinine, Ser: 0.73 mg/dL (ref 0.40–1.20)
GFR: 128.37 mL/min (ref >60.00)
Glucose, Bld: 87 mg/dL (ref 70–99)
POTASSIUM: 4 meq/L (ref 3.5–5.1)
SODIUM: 137 meq/L (ref 135–145)

## 2017-03-24 LAB — POC URINALSYSI DIPSTICK (AUTOMATED)
Bilirubin, UA: NEGATIVE
Blood, UA: NEGATIVE
Glucose, UA: NEGATIVE
Ketones, UA: NEGATIVE
Leukocytes, UA: NEGATIVE
Nitrite, UA: NEGATIVE
Protein, UA: NEGATIVE
Spec Grav, UA: <=1.005 — AB
Urobilinogen, UA: 0.2 U/dL
pH, UA: 7.5

## 2017-03-24 NOTE — Patient Instructions (Signed)
BEFORE YOU LEAVE: -follow up: 3 months -lab for urine studies  We have ordered labs or studies at this visit. It can take up to 1-2 weeks for results and processing. IF results require follow up or explanation, we will call you with instructions. Clinically stable results will be released to your [facility]. If you have not heard from us or cannot find your results in [facility] in 2 weeks please contact our office at [phone].  If you are not yet signed up for [facility], please consider signing up.   Please call to schedule appointment with behavioral medicine regarding the anxiety. Follow up if worsening or new concerns.   We recommend the following healthy lifestyle for LIFE: 1) Small portions.   Tip: eat off of a salad plate instead of a dinner plate.  Tip: if you need more or a snack choose fruits, veggies and/or a handful of nuts or seeds.  2) Eat a healthy clean diet.  * Tip: Avoid (less then 1 serving per week): processed foods, sweets, sweetened drinks, white starches (rice, flour, bread, potatoes, pasta, etc), red meat, fast foods, butter  *Tip: CHOOSE instead   * 5-9 servings per day of fresh or frozen fruits and vegetables (but not corn, potatoes, bananas, canned or dried fruit)   *nuts and seeds, beans   *olives and olive oil   *small portions of lean meats such as fish and white chicken    *small portions of whole grains  3)Get at least 150 minutes of sweaty aerobic exercise per week.  4)Reduce stress - consider counseling, meditation and relaxation to balance other aspects of your life.

## 2017-03-25 ENCOUNTER — Encounter: Payer: Self-pay | Admitting: Family Medicine

## 2017-03-25 NOTE — Addendum Note (Signed)
Addended by: Sallee LangeFUNDERBURK, Fraida Veldman on: 03/25/2017 02:36 PM   Modules accepted: Orders

## 2017-03-28 ENCOUNTER — Encounter: Payer: Self-pay | Admitting: Family Medicine

## 2017-04-05 ENCOUNTER — Telehealth: Payer: Self-pay | Admitting: Family Medicine

## 2017-04-05 NOTE — Telephone Encounter (Signed)
° ° ° °  Pt call to say the last time she saw Dr Selena BattenKim she told her of a medicine that she could try for anxiety. She is asking if this is something that Dr Selena BattenKim will RX or if she need to see someone to have it RX.    [phone]

## 2017-04-07 NOTE — Progress Notes (Signed)
HPI:  GAD: -worries all the time about everything -reports never actually dx with bipolar but friend remotely told her that when she got very anxious in a bad situation -she now has been anxious, worried constantly as will [place] moving to [place] as was accepted to a university - she is stressed about the move and list of things to do -has occ panic symptoms -hx of some anhedonia and depressed mood -denies depression, thoughts of harm to self or others, safety concerns, panic, manic symptoms, hallucinations -she does smoke pot to help but wants to quit -drinks rarely - 2-3 drinks once 1-3 times per month -denies use of any other drugs   Protein in urine: -on dips with gyn this year -none on urine testing here -asymptomatic -advised recheck in 1 month    ROS: See pertinent positives and negatives per HPI.  Past Medical History:  Diagnosis Date  . Anemia   . Asthma   . GAD (generalized anxiety disorder)   . Genital warts   . HSV infection     No past surgical history on file.  Family History  Problem Relation Age of Onset  . Hypertension Mother   . Cancer Maternal Grandmother        lymphoma  . Cancer Maternal Grandfather        prostate  . Diabetes Maternal Grandfather   . Breast cancer Other        Maternal Great Aunts and GreatGreat GM    Social History   Social History  . Marital status: Single    Spouse name: N/A  . Number of children: N/A  . Years of education: N/A   Social History Main Topics  . Smoking status: Never Smoker  . Smokeless tobacco: Never Used  . Alcohol use No  . Drug use: No  . Sexual activity: No   Other Topics Concern  . 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  albuterol (PROVENTIL HFA;VENTOLIN HFA) 108 (90 BASE) MCG/ACT inhaler, Inhale 2 puffs into the lungs every 6  (six) hours as needed., Disp: , Rfl:  .  etonogestrel (NEXPLANON) 68 MG IMPL implant, 1 each by Subdermal route once., Disp: , Rfl:  .  PARoxetine (PAXIL) 20 MG tablet, Take 1 tablet (20 mg total) by mouth daily., Disp: 30 tablet, Rfl: 0  EXAM:  Vitals:   04/08/17 1101  BP: 98/76  Temp: 98.4 F (36.9 C)    Body mass index is 21.9 kg/m.  GENERAL: vitals reviewed and listed above, alert, oriented, appears well hydrated and in no acute distress  HEENT: atraumatic, conjunttiva clear, no obvious abnormalities on inspection of external nose and ears  NECK: no obvious masses on inspection  LUNGS: clear to auscultation bilaterally, no wheezes, rales or rhonchi, good air movement  CV: HRRR, no peripheral edema  MS: moves all extremities without noticeable abnormality  PSYCH: pleasant and cooperative, no obvious depression or anxiety  ASSESSMENT AND PLAN:  Discussed the following assessment and plan:  GAD (generalized anxiety disorder)  Alcohol use  Abnormal finding in urine  -discussed tx options and risks for management of GAD and Depression -she opted to start paxil, risks discussed at length -it now sounds like there was not a hx of bipolar /  o, and does not seem to have symptoms of this - did still discuss this and risks with SSRI -advise not using weed, alcohol or other drugs with this medication -advised CBT -she will be moving in 3-4 weeks and plans to establish with student health and counseling at school -lifestyle recs -return precautions -follow up 2 weeks - will plan to check urine again and provide further refills if doing well then  -Patient advised to return or notify a doctor immediately if symptoms worsen or persist or new concerns arise.  Patient Instructions  BEFORE YOU LEAVE: -follow up: 2 weeks  Start the Paxil once daily.  Stop alcohol and smoking.  I hope you are feeling better soon! Follow up sooner if worsening, new concerns or you are not  improving with treatment.   Paroxetine tablets What is this medicine? PAROXETINE (pa ROX e teen) is used to treat depression, anxiety disorders, obsessive compulsive disorder, panic attacks, post traumatic stress, and premenstrual dysphoric disorder (PMDD). This medicine may be used for other purposes; ask your health care provider or pharmacist if you have questions. COMMON BRAND NAME(S): Paxil, Pexeva What should I tell my health care provider before I take this medicine? They need to know if you have any of these conditions: -bipolar disorder or a family history of bipolar disorder -bleeding disorders -glaucoma -heart disease -kidney disease -liver disease -low levels of sodium in the blood -seizures -suicidal thoughts, plans, or attempt; a previous suicide attempt by you or a family member -take MAOIs like Carbex, Eldepryl, Marplan, Nardil, and Parnate -take medicines that treat or prevent blood clots -thyroid disease -an unusual or allergic reaction to paroxetine, other medicines, foods, dyes, or preservatives -pregnant or trying to get pregnant -breast-feeding How should I use this medicine? Take this medicine by mouth with a glass of water. Follow the directions on the prescription label. You can take it with or without food. Take your medicine at regular intervals. Do not take your medicine more often than directed. Do not stop taking this medicine suddenly except upon the advice of your doctor. Stopping this medicine too quickly may cause serious side effects or your condition may worsen. A special MedGuide will be given to you by the pharmacist with each prescription and refill. Be sure to read this information carefully each time. Talk to your pediatrician regarding the use of this medicine in children. Special care may be needed. Overdosage: If you think you have taken too much of this medicine contact a poison control center or emergency room at once. NOTE: This medicine is  only for you. Do not share this medicine with others. What if I miss a dose? If you miss a dose, take it as soon as you can. If it is almost time for your next dose, take only that dose. Do not take double or extra doses. What may interact with this medicine? Do not take this medicine with any of the following medications: -linezolid -MAOIs like Carbex, Eldepryl, Marplan, Nardil, and Parnate -methylene blue (injected into a vein) -pimozide -thioridazine This medicine may also interact with the following medications: -alcohol -amphetamines -aspirin and aspirin-like medicines -atomoxetine -certain medicines for depression, anxiety, or psychotic disturbances -certain medicines for irregular heart beat like propafenone, flecainide, encainide, and quinidine -certain medicines for migraine headache like almotriptan, eletriptan, frovatriptan, naratriptan, rizatriptan, sumatriptan, zolmitriptan -cimetidine -digoxin -diuretics -fentanyl -fosamprenavir -furazolidone -isoniazid -lithium -medicines that treat or prevent blood clots like warfarin, enoxaparin, and dalteparin -medicines for sleep -NSAIDs, medicines for pain  and inflammation, like ibuprofen or naproxen -phenobarbital -phenytoin -procarbazine -rasagiline -ritonavir -supplements like St. John's wort, kava kava, valerian -tamoxifen -tramadol -tryptophan This list may not describe all possible interactions. Give your health care provider a list of all the medicines, herbs, non-prescription drugs, or dietary supplements you use. Also tell them if you smoke, drink alcohol, or use illegal drugs. Some items may interact with your medicine. What should I watch for while using this medicine? Tell your doctor if your symptoms do not get better or if they get worse. Visit your doctor or health care professional for regular checks on your progress. Because it may take several weeks to see the full effects of this medicine, it is important  to continue your treatment as prescribed by your doctor. Patients and their families should watch out for new or worsening thoughts of suicide or depression. Also watch out for sudden changes in feelings such as feeling anxious, agitated, panicky, irritable, hostile, aggressive, impulsive, severely restless, overly excited and hyperactive, or not being able to sleep. If this happens, especially at the beginning of treatment or after a change in dose, call your health care professional. Bonita QuinYou may get drowsy or dizzy. Do not drive, use machinery, or do anything that needs mental alertness until you know how this medicine affects you. Do not stand or sit up quickly, especially if you are an older patient. This reduces the risk of dizzy or fainting spells. Alcohol may interfere with the effect of this medicine. Avoid alcoholic drinks. Your mouth may get dry. Chewing sugarless gum or sucking hard candy, and drinking plenty of water will help. Contact your doctor if the problem does not go away or is severe. What side effects may I notice from receiving this medicine? Side effects that you should report to your doctor or health care professional as soon as possible: -allergic reactions like skin rash, itching or hives, swelling of the face, lips, or tongue -anxious -black, tarry stools -changes in vision -confusion -elevated mood, decreased need for sleep, racing thoughts, impulsive behavior -eye pain -fast, irregular heartbeat -feeling faint or lightheaded, falls -feeling agitated, angry, or irritable -hallucination, loss of contact with reality -loss of balance or coordination -loss of memory -painful or prolonged erections -restlessness, pacing, inability to keep still -seizures -stiff muscles -suicidal thoughts or other mood changes -trouble sleeping -unusual bleeding or bruising -unusually weak or tired -vomiting Side effects that usually do not require medical attention (report to your  doctor or health care professional if they continue or are bothersome): -change in appetite or weight -change in sex drive or performance -diarrhea -dizziness -dry mouth -increased sweating -indigestion, nausea -tired -tremors This list may not describe all possible side effects. Call your doctor for medical advice about side effects. You may report side effects to FDA at 1-800-FDA-1088. Where should I keep my medicine? Keep out of the reach of children. Store at room temperature between 15 and 30 degrees C (59 and 86 degrees F). Keep container tightly closed. Throw away any unused medicine after the expiration date. NOTE: This sheet is a summary. It may not cover all possible information. If you have questions about this medicine, talk to your doctor, pharmacist, or health care provider.  2018 Elsevier/Gold Standard (2016-03-06 15:50:32)    Kriste BasqueKIM, HANNAH R., DO

## 2017-04-07 NOTE — Telephone Encounter (Signed)
Would prefer appt to discuss options and see what would work best for her. Thanks.

## 2017-04-08 ENCOUNTER — Ambulatory Visit (INDEPENDENT_AMBULATORY_CARE_PROVIDER_SITE_OTHER): Payer: 59 | Admitting: Family Medicine

## 2017-04-08 ENCOUNTER — Encounter: Payer: Self-pay | Admitting: Family Medicine

## 2017-04-08 VITALS — BP 98/76 | Temp 98.4°F | Wt 130.6 lb

## 2017-04-08 DIAGNOSIS — Z7289 Other problems related to lifestyle: Secondary | ICD-10-CM

## 2017-04-08 DIAGNOSIS — Z789 Other specified health status: Secondary | ICD-10-CM

## 2017-04-08 DIAGNOSIS — F411 Generalized anxiety disorder: Secondary | ICD-10-CM | POA: Diagnosis not present

## 2017-04-08 DIAGNOSIS — A630 Anogenital (venereal) warts: Secondary | ICD-10-CM | POA: Diagnosis not present

## 2017-04-08 DIAGNOSIS — R8290 Unspecified abnormal findings in urine: Secondary | ICD-10-CM | POA: Diagnosis not present

## 2017-04-08 MED ORDER — PAROXETINE HCL 20 MG PO TABS
20.0000 mg | ORAL_TABLET | Freq: Every day | ORAL | 0 refills | Status: DC
Start: 2017-04-08 — End: 2017-05-17

## 2017-04-08 NOTE — Telephone Encounter (Signed)
Left message on machine for patient to return our call to schedule an appointment. 

## 2017-04-08 NOTE — Patient Instructions (Signed)
BEFORE YOU LEAVE: -follow up: 2 weeks  Start the Paxil once daily.  Stop alcohol and smoking.  I hope you are feeling better soon! Follow up sooner if worsening, new concerns or you are not improving with treatment.   Paroxetine tablets What is this medicine? PAROXETINE (pa ROX e teen) is used to treat depression, anxiety disorders, obsessive compulsive disorder, panic attacks, post traumatic stress, and premenstrual dysphoric disorder (PMDD). This medicine may be used for other purposes; ask your health care provider or pharmacist if you have questions. COMMON BRAND NAME(S): Paxil, Pexeva What should I tell my health care provider before I take this medicine? They need to know if you have any of these conditions: -bipolar disorder or a family history of bipolar disorder -bleeding disorders -glaucoma -heart disease -kidney disease -liver disease -low levels of sodium in the blood -seizures -suicidal thoughts, plans, or attempt; a previous suicide attempt by you or a family member -take MAOIs like Carbex, Eldepryl, Marplan, Nardil, and Parnate -take medicines that treat or prevent blood clots -thyroid disease -an unusual or allergic reaction to paroxetine, other medicines, foods, dyes, or preservatives -pregnant or trying to get pregnant -breast-feeding How should I use this medicine? Take this medicine by mouth with a glass of water. Follow the directions on the prescription label. You can take it with or without food. Take your medicine at regular intervals. Do not take your medicine more often than directed. Do not stop taking this medicine suddenly except upon the advice of your doctor. Stopping this medicine too quickly may cause serious side effects or your condition may worsen. A special MedGuide will be given to you by the pharmacist with each prescription and refill. Be sure to read this information carefully each time. Talk to your pediatrician regarding the use of this  medicine in children. Special care may be needed. Overdosage: If you think you have taken too much of this medicine contact a poison control center or emergency room at once. NOTE: This medicine is only for you. Do not share this medicine with others. What if I miss a dose? If you miss a dose, take it as soon as you can. If it is almost time for your next dose, take only that dose. Do not take double or extra doses. What may interact with this medicine? Do not take this medicine with any of the following medications: -linezolid -MAOIs like Carbex, Eldepryl, Marplan, Nardil, and Parnate -methylene blue (injected into a vein) -pimozide -thioridazine This medicine may also interact with the following medications: -alcohol -amphetamines -aspirin and aspirin-like medicines -atomoxetine -certain medicines for depression, anxiety, or psychotic disturbances -certain medicines for irregular heart beat like propafenone, flecainide, encainide, and quinidine -certain medicines for migraine headache like almotriptan, eletriptan, frovatriptan, naratriptan, rizatriptan, sumatriptan, zolmitriptan -cimetidine -digoxin -diuretics -fentanyl -fosamprenavir -furazolidone -isoniazid -lithium -medicines that treat or prevent blood clots like warfarin, enoxaparin, and dalteparin -medicines for sleep -NSAIDs, medicines for pain and inflammation, like ibuprofen or naproxen -phenobarbital -phenytoin -procarbazine -rasagiline -ritonavir -supplements like St. John's wort, kava kava, valerian -tamoxifen -tramadol -tryptophan This list may not describe all possible interactions. Give your health care provider a list of all the medicines, herbs, non-prescription drugs, or dietary supplements you use. Also tell them if you smoke, drink alcohol, or use illegal drugs. Some items may interact with your medicine. What should I watch for while using this medicine? Tell your doctor if your symptoms do not get  better or if they get worse. Visit your doctor or  health care professional for regular checks on your progress. Because it may take several weeks to see the full effects of this medicine, it is important to continue your treatment as prescribed by your doctor. Patients and their families should watch out for new or worsening thoughts of suicide or depression. Also watch out for sudden changes in feelings such as feeling anxious, agitated, panicky, irritable, hostile, aggressive, impulsive, severely restless, overly excited and hyperactive, or not being able to sleep. If this happens, especially at the beginning of treatment or after a change in dose, call your health care professional. Bonita QuinYou may get drowsy or dizzy. Do not drive, use machinery, or do anything that needs mental alertness until you know how this medicine affects you. Do not stand or sit up quickly, especially if you are an older patient. This reduces the risk of dizzy or fainting spells. Alcohol may interfere with the effect of this medicine. Avoid alcoholic drinks. Your mouth may get dry. Chewing sugarless gum or sucking hard candy, and drinking plenty of water will help. Contact your doctor if the problem does not go away or is severe. What side effects may I notice from receiving this medicine? Side effects that you should report to your doctor or health care professional as soon as possible: -allergic reactions like skin rash, itching or hives, swelling of the face, lips, or tongue -anxious -black, tarry stools -changes in vision -confusion -elevated mood, decreased need for sleep, racing thoughts, impulsive behavior -eye pain -fast, irregular heartbeat -feeling faint or lightheaded, falls -feeling agitated, angry, or irritable -hallucination, loss of contact with reality -loss of balance or coordination -loss of memory -painful or prolonged erections -restlessness, pacing, inability to keep still -seizures -stiff  muscles -suicidal thoughts or other mood changes -trouble sleeping -unusual bleeding or bruising -unusually weak or tired -vomiting Side effects that usually do not require medical attention (report to your doctor or health care professional if they continue or are bothersome): -change in appetite or weight -change in sex drive or performance -diarrhea -dizziness -dry mouth -increased sweating -indigestion, nausea -tired -tremors This list may not describe all possible side effects. Call your doctor for medical advice about side effects. You may report side effects to FDA at 1-800-FDA-1088. Where should I keep my medicine? Keep out of the reach of children. Store at room temperature between 15 and 30 degrees C (59 and 86 degrees F). Keep container tightly closed. Throw away any unused medicine after the expiration date. NOTE: This sheet is a summary. It may not cover all possible information. If you have questions about this medicine, talk to your doctor, pharmacist, or health care provider.  2018 Elsevier/Gold Standard (2016-03-06 15:50:32)

## 2017-04-08 NOTE — Telephone Encounter (Signed)
Patient was seen in the office today

## 2017-04-14 DIAGNOSIS — N898 Other specified noninflammatory disorders of vagina: Secondary | ICD-10-CM | POA: Diagnosis not present

## 2017-04-14 DIAGNOSIS — A630 Anogenital (venereal) warts: Secondary | ICD-10-CM | POA: Diagnosis not present

## 2017-04-14 DIAGNOSIS — N760 Acute vaginitis: Secondary | ICD-10-CM | POA: Diagnosis not present

## 2017-04-21 DIAGNOSIS — A630 Anogenital (venereal) warts: Secondary | ICD-10-CM | POA: Diagnosis not present

## 2017-04-25 ENCOUNTER — Other Ambulatory Visit (INDEPENDENT_AMBULATORY_CARE_PROVIDER_SITE_OTHER): Payer: 59

## 2017-04-25 DIAGNOSIS — R8290 Unspecified abnormal findings in urine: Secondary | ICD-10-CM

## 2017-04-25 LAB — POC URINALSYSI DIPSTICK (AUTOMATED)
Bilirubin, UA: NEGATIVE
Glucose, UA: NEGATIVE
KETONES UA: NEGATIVE
Leukocytes, UA: NEGATIVE
Nitrite, UA: NEGATIVE
PH UA: 6.5 (ref 5.0–8.0)
PROTEIN UA: NEGATIVE
RBC UA: NEGATIVE
UROBILINOGEN UA: 0.2 U/dL

## 2017-04-29 DIAGNOSIS — A630 Anogenital (venereal) warts: Secondary | ICD-10-CM | POA: Diagnosis not present

## 2017-05-02 ENCOUNTER — Ambulatory Visit: Payer: Self-pay | Admitting: Family Medicine

## 2017-05-03 ENCOUNTER — Ambulatory Visit (INDEPENDENT_AMBULATORY_CARE_PROVIDER_SITE_OTHER): Payer: 59 | Admitting: Family Medicine

## 2017-05-03 ENCOUNTER — Encounter: Payer: Self-pay | Admitting: Family Medicine

## 2017-05-03 VITALS — BP 100/82 | HR 76 | Temp 98.6°F | Ht 64.75 in | Wt 131.6 lb

## 2017-05-03 DIAGNOSIS — Z7189 Other specified counseling: Secondary | ICD-10-CM

## 2017-05-03 DIAGNOSIS — Z7185 Encounter for immunization safety counseling: Secondary | ICD-10-CM

## 2017-05-03 NOTE — Progress Notes (Addendum)
  HPI:  Acute visit for vaccine counseling: -she doesn't think she had chicken pox -thinks had varicella vaccines but does not have record of this -needs proof of immunity for school -no symptoms current infection  ROS: See pertinent positives and negatives per HPI.  Past Medical History:  Diagnosis Date  . Anemia   . Asthma   . GAD (generalized anxiety disorder)   . Genital warts   . HSV infection     No past surgical history on file.  Family History  Problem Relation Age of Onset  . Hypertension Mother   . Cancer Maternal Grandmother        lymphoma  . Cancer Maternal Grandfather        prostate  . Diabetes Maternal Grandfather   . Breast cancer Other        Maternal Great Aunts and GreatGreat GM    Social History   Social History  . Marital status: Single    Spouse name: N/A  . Number of children: N/A  . Years of education: N/A   Social History Main Topics  . Smoking status: Never Smoker  . Smokeless tobacco: Never Used  . Alcohol use No  . Drug use: No  . Sexual activity: No   Other Topics Concern  . 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  albuterol (PROVENTIL HFA;VENTOLIN HFA) 108 (90 BASE) MCG/ACT inhaler, Inhale 2 puffs into the lungs every 6 (six) hours as needed., Disp: , Rfl:  .  etonogestrel (NEXPLANON) 68 MG IMPL implant, 1 each by Subdermal route once., Disp: , Rfl:  .  PARoxetine (PAXIL) 20 MG tablet, Take 1 tablet (20 mg total) by mouth daily., Disp: 30 tablet, Rfl: 0  EXAM:  Vitals:   05/03/17 1616  BP: 100/82  Pulse: 76  Temp: 98.6 F (37 C)    Body mass index is 22.07 kg/m.  GENERAL: vitals reviewed and listed above, alert, oriented, appears well hydrated and in no acute distress  MS: moves all extremities without noticeable abnormality  PSYCH: pleasant and  cooperative, no obvious depression or anxiety  ASSESSMENT AND PLAN:  Discussed the following assessment and plan:  Vaccine counseling - Plan: Varicella zoster antibody, IgG  -will get titer   Patient Instructions  BEFORE YOU LEAVE: -lab for titer  We have ordered labs or studies at this visit. It can take up to 1-2 weeks for results and processing. IF results require follow up or explanation, we will call you with instructions. Clinically stable results will be released to your [facility]. If you have not heard from us or cannot find your results in [facility] in 2 weeks please contact our office at [phone].  If you are not yet signed up for [facility], please consider signing up.           Kriste BasqueKIM, Lenix Benoist R., DO

## 2017-05-03 NOTE — Patient Instructions (Signed)
BEFORE YOU LEAVE: -lab for titer  We have ordered labs or studies at this visit. It can take up to 1-2 weeks for results and processing. IF results require follow up or explanation, we will call you with instructions. Clinically stable results will be released to your [facility]. If you have not heard from us or cannot find your results in [facility] in 2 weeks please contact our office at [phone].  If you are not yet signed up for [facility], please consider signing up.

## 2017-05-04 LAB — VARICELLA ZOSTER ANTIBODY, IGG: VARICELLA IGG: 2890 {index} — AB (ref <135.00)

## 2017-05-05 ENCOUNTER — Encounter: Payer: Self-pay | Admitting: *Deleted

## 2017-05-06 DIAGNOSIS — A630 Anogenital (venereal) warts: Secondary | ICD-10-CM | POA: Diagnosis not present

## 2017-05-17 ENCOUNTER — Other Ambulatory Visit: Payer: Self-pay | Admitting: Family Medicine

## 2017-05-19 DIAGNOSIS — A630 Anogenital (venereal) warts: Secondary | ICD-10-CM | POA: Diagnosis not present

## 2017-06-13 DIAGNOSIS — Z111 Encounter for screening for respiratory tuberculosis: Secondary | ICD-10-CM | POA: Diagnosis not present

## 2017-06-13 DIAGNOSIS — Z020 Encounter for examination for admission to educational institution: Secondary | ICD-10-CM | POA: Diagnosis not present

## 2017-06-17 DIAGNOSIS — N926 Irregular menstruation, unspecified: Secondary | ICD-10-CM | POA: Diagnosis not present

## 2017-06-17 DIAGNOSIS — N898 Other specified noninflammatory disorders of vagina: Secondary | ICD-10-CM | POA: Diagnosis not present

## 2017-07-15 DIAGNOSIS — N898 Other specified noninflammatory disorders of vagina: Secondary | ICD-10-CM | POA: Diagnosis not present

## 2017-07-15 DIAGNOSIS — B373 Candidiasis of vulva and vagina: Secondary | ICD-10-CM | POA: Diagnosis not present

## 2017-07-15 DIAGNOSIS — N760 Acute vaginitis: Secondary | ICD-10-CM | POA: Diagnosis not present

## 2017-09-09 DIAGNOSIS — H5203 Hypermetropia, bilateral: Secondary | ICD-10-CM | POA: Diagnosis not present

## 2017-09-22 DIAGNOSIS — Z01419 Encounter for gynecological examination (general) (routine) without abnormal findings: Secondary | ICD-10-CM | POA: Diagnosis not present

## 2017-09-22 DIAGNOSIS — Z113 Encounter for screening for infections with a predominantly sexual mode of transmission: Secondary | ICD-10-CM | POA: Diagnosis not present

## 2017-10-04 DIAGNOSIS — R87615 Unsatisfactory cytologic smear of cervix: Secondary | ICD-10-CM | POA: Diagnosis not present

## 2017-11-04 DIAGNOSIS — N760 Acute vaginitis: Secondary | ICD-10-CM | POA: Diagnosis not present

## 2017-11-22 ENCOUNTER — Telehealth: Payer: Self-pay | Admitting: *Deleted

## 2017-11-22 NOTE — Telephone Encounter (Signed)
I am sorry to hear she is not feeling well. Would recommend appt to eval, check some labs and possible referral if needed. Does she have any breaks coming up when she could come?

## 2017-11-22 NOTE — Telephone Encounter (Signed)
I called the pt for more information.  Patient complains of "morning sickness" and nausea and vomiting every day for the past 2 years and states this has been worse lately.  She stated she thought this was due to another issue but found it was not related.  Stated her LMP was 11/14/2017.  States she is burping a lot, notices some diarrhea and [facility] not [facility] as she is in [place] and her insurance is not accepted there.  Message sent to Dr Selena BattenKim.       Copied from CRM [phone]. Topic: Referral - Request >> Nov 22, 2017  9:35 AM Windy KalataMichael, Taylor L, NT wrote: Patient is calling and states she is away at school but is requesting to see a digestive disorder specialist.

## 2017-11-22 NOTE — Telephone Encounter (Signed)
Please obtain more information.  Reason for request?  Symptoms?  Is she asking to see a specialist here or where she is at school?  If so, need location.  We may need to see her, prior to referral depending on the situation. If she is at school and cannot make it back here for an appointment for evaluation, she may be able to go to student health?

## 2017-11-24 NOTE — Telephone Encounter (Signed)
I called the pt and informed her of the message below.  Patient stated she has a break in March and declined an appt, stated she will contact an urgent care near her school for an appt.

## 2017-11-29 DIAGNOSIS — R112 Nausea with vomiting, unspecified: Secondary | ICD-10-CM | POA: Diagnosis not present

## 2017-11-29 DIAGNOSIS — N6452 Nipple discharge: Secondary | ICD-10-CM | POA: Diagnosis not present

## 2017-11-29 DIAGNOSIS — R197 Diarrhea, unspecified: Secondary | ICD-10-CM | POA: Diagnosis not present

## 2017-11-30 DIAGNOSIS — R197 Diarrhea, unspecified: Secondary | ICD-10-CM | POA: Diagnosis not present

## 2017-12-01 DIAGNOSIS — N72 Inflammatory disease of cervix uteri: Secondary | ICD-10-CM | POA: Diagnosis not present

## 2017-12-01 DIAGNOSIS — R87610 Atypical squamous cells of undetermined significance on cytologic smear of cervix (ASC-US): Secondary | ICD-10-CM | POA: Diagnosis not present

## 2017-12-01 DIAGNOSIS — R87810 Cervical high risk human papillomavirus (HPV) DNA test positive: Secondary | ICD-10-CM | POA: Diagnosis not present

## 2017-12-06 DIAGNOSIS — R112 Nausea with vomiting, unspecified: Secondary | ICD-10-CM | POA: Diagnosis not present

## 2017-12-06 DIAGNOSIS — R195 Other fecal abnormalities: Secondary | ICD-10-CM | POA: Diagnosis not present

## 2017-12-07 DIAGNOSIS — Z3046 Encounter for surveillance of implantable subdermal contraceptive: Secondary | ICD-10-CM | POA: Diagnosis not present

## 2017-12-18 DIAGNOSIS — Z113 Encounter for screening for infections with a predominantly sexual mode of transmission: Secondary | ICD-10-CM | POA: Diagnosis not present

## 2017-12-23 DIAGNOSIS — N760 Acute vaginitis: Secondary | ICD-10-CM | POA: Diagnosis not present

## 2018-01-20 DIAGNOSIS — R350 Frequency of micturition: Secondary | ICD-10-CM | POA: Diagnosis not present

## 2018-02-09 DIAGNOSIS — N912 Amenorrhea, unspecified: Secondary | ICD-10-CM | POA: Diagnosis not present

## 2018-02-09 DIAGNOSIS — N760 Acute vaginitis: Secondary | ICD-10-CM | POA: Diagnosis not present

## 2018-03-28 DIAGNOSIS — N760 Acute vaginitis: Secondary | ICD-10-CM | POA: Diagnosis not present

## 2018-03-28 DIAGNOSIS — R87619 Unspecified abnormal cytological findings in specimens from cervix uteri: Secondary | ICD-10-CM | POA: Diagnosis not present

## 2018-03-28 DIAGNOSIS — L750 Bromhidrosis: Secondary | ICD-10-CM | POA: Diagnosis not present

## 2018-03-28 DIAGNOSIS — R5383 Other fatigue: Secondary | ICD-10-CM | POA: Diagnosis not present

## 2018-06-27 DIAGNOSIS — N6452 Nipple discharge: Secondary | ICD-10-CM | POA: Diagnosis not present

## 2018-10-25 DIAGNOSIS — Z111 Encounter for screening for respiratory tuberculosis: Secondary | ICD-10-CM | POA: Diagnosis not present

## 2019-03-29 ENCOUNTER — Other Ambulatory Visit: Payer: Self-pay

## 2019-03-29 ENCOUNTER — Encounter: Payer: Self-pay | Admitting: Family Medicine

## 2019-03-29 ENCOUNTER — Telehealth: Payer: Self-pay | Admitting: Family Medicine

## 2019-03-29 ENCOUNTER — Ambulatory Visit (INDEPENDENT_AMBULATORY_CARE_PROVIDER_SITE_OTHER): Payer: 59 | Admitting: Family Medicine

## 2019-03-29 DIAGNOSIS — J45990 Exercise induced bronchospasm: Secondary | ICD-10-CM | POA: Diagnosis not present

## 2019-03-29 MED ORDER — ALBUTEROL SULFATE HFA 108 (90 BASE) MCG/ACT IN AERS: 2.0000 | INHALATION_SPRAY | Freq: Four times a day (QID) | RESPIRATORY_TRACT | 3 refills | Status: DC | PRN

## 2019-03-29 NOTE — Telephone Encounter (Signed)
Medication refill.  albuterol (PROVENTIL HFA;VENTOLIN HFA) 108 (90 BASE) MCG/ACT inhaler   [place], [place]. [phone] (Phone) [phone] (Fax)

## 2019-03-29 NOTE — Progress Notes (Signed)
Virtual Visit via Video Note  I connected with Mallory Freeman   on 03/29/19 at 12:40 PM EDT by a video enabled telemedicine application and verified that I am speaking with the correct person using two identifiers.  Location patient: home Location provider:work or home office Persons participating in the virtual visit: patient, provider  I discussed the limitations of evaluation and management by telemedicine and the availability of in person appointments. The patient expressed understanding and agreed to proceed.   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  Past Medical History:  Diagnosis Date  . Anemia   . Asthma   . GAD (generalized anxiety disorder)   . Genital warts   . HSV infection     No past surgical history on file.  Family History  Problem Relation Age of Onset  . Hypertension Mother   . Cancer Maternal Grandmother        lymphoma  . Cancer Maternal Grandfather        prostate  . Diabetes Maternal Grandfather   . Breast cancer Other        Maternal Great Aunts and GreatGreat GM    SOCIAL HX: see hpi   Current Outpatient Medications:  .  albuterol (VENTOLIN HFA) 108 (90 Base) MCG/ACT inhaler, Inhale 2 puffs into the lungs every 6 (six) hours as needed., Disp: 2 Inhaler, Rfl: 3  EXAM:  VITALS per patient if applicable:  GENERAL: alert, oriented, appears well and in no acute distress  HEENT: atraumatic, conjunttiva clear, no obvious abnormalities on inspection of external nose and ears  NECK: normal movements of the head and neck  LUNGS: on inspection no signs of respiratory distress, breathing rate appears normal, no  obvious gross SOB, gasping or wheezing  CV: no obvious cyanosis  MS: moves all visible extremities without noticeable abnormality  PSYCH/NEURO: pleasant and cooperative, no obvious depression or anxiety, speech and thought processing grossly intact  ASSESSMENT AND PLAN:  Discussed the following assessment and plan:  Exercise-induced asthma - Plan:  -refilled provided -reviewed and updated medication list and SH -follow up yearly and as needed, she is agreeable to virtual visits with me and will see Dr. Ethlyn Gallery only if needs in office visit or CPE     I discussed the assessment and treatment plan with the patient. The patient was provided an opportunity to ask questions and all were answered. The patient agreed with the plan and demonstrated an understanding of the instructions.   The patient was advised to call back or seek an in-person evaluation if the symptoms worsen or if the condition fails to improve as anticipated.   Lucretia Kern, DO

## 2019-03-29 NOTE — Telephone Encounter (Signed)
I called the pt and left a detailed message at her cell number stating she has not been seen in over a year and asked that she call back to schedule a phone visit with Dr Maudie Mercury to discuss the medication.

## 2019-07-03 DIAGNOSIS — Z20828 Contact with and (suspected) exposure to other viral communicable diseases: Secondary | ICD-10-CM | POA: Diagnosis not present

## 2019-09-10 ENCOUNTER — Other Ambulatory Visit: Payer: Self-pay

## 2019-09-10 DIAGNOSIS — Z20822 Contact with and (suspected) exposure to covid-19: Secondary | ICD-10-CM

## 2019-09-12 LAB — NOVEL CORONAVIRUS, NAA: SARS-CoV-2, NAA: NOT DETECTED

## 2019-09-17 DIAGNOSIS — Z130 Encounter for screening for diseases of the blood and blood-forming organs and certain disorders involving the immune mechanism: Secondary | ICD-10-CM | POA: Diagnosis not present

## 2019-09-17 DIAGNOSIS — Z01419 Encounter for gynecological examination (general) (routine) without abnormal findings: Secondary | ICD-10-CM | POA: Diagnosis not present

## 2019-09-17 DIAGNOSIS — Z1389 Encounter for screening for other disorder: Secondary | ICD-10-CM | POA: Diagnosis not present

## 2019-09-17 DIAGNOSIS — Z6828 Body mass index (BMI) 28.0-28.9, adult: Secondary | ICD-10-CM | POA: Diagnosis not present

## 2019-09-17 DIAGNOSIS — A630 Anogenital (venereal) warts: Secondary | ICD-10-CM | POA: Diagnosis not present

## 2019-09-17 DIAGNOSIS — Z124 Encounter for screening for malignant neoplasm of cervix: Secondary | ICD-10-CM | POA: Diagnosis not present

## 2019-09-18 DIAGNOSIS — Z124 Encounter for screening for malignant neoplasm of cervix: Secondary | ICD-10-CM | POA: Diagnosis not present

## 2019-10-10 DIAGNOSIS — N898 Other specified noninflammatory disorders of vagina: Secondary | ICD-10-CM | POA: Diagnosis not present

## 2019-10-10 DIAGNOSIS — R82998 Other abnormal findings in urine: Secondary | ICD-10-CM | POA: Diagnosis not present

## 2019-10-29 DIAGNOSIS — Z20828 Contact with and (suspected) exposure to other viral communicable diseases: Secondary | ICD-10-CM | POA: Diagnosis not present

## 2019-12-05 DIAGNOSIS — N898 Other specified noninflammatory disorders of vagina: Secondary | ICD-10-CM | POA: Diagnosis not present

## 2019-12-05 DIAGNOSIS — N760 Acute vaginitis: Secondary | ICD-10-CM | POA: Diagnosis not present

## 2019-12-16 ENCOUNTER — Ambulatory Visit: Payer: 59 | Attending: Internal Medicine

## 2019-12-16 DIAGNOSIS — Z23 Encounter for immunization: Secondary | ICD-10-CM | POA: Insufficient documentation

## 2019-12-16 NOTE — Progress Notes (Signed)
   Covid-19 Vaccination Clinic  Name:  Mallory Freeman    MRN: [id number] DOB: 24-Nov-1995  12/16/2019  [name] was observed post Covid-19 immunization for 15 minutes without incidence. She was provided with Vaccine Information Sheet and instruction to access the V-Safe system.   [name] was instructed to call 911 with any severe reactions post vaccine: Marland Kitchen Difficulty breathing  . Swelling of your face and throat  . A fast heartbeat  . A bad rash all over your body  . Dizziness and weakness    Immunizations Administered    Name Date Dose VIS Date Route   Pfizer COVID-19 Vaccine 12/16/2019 10:01 AM 0.3 mL 09/28/2019 Intramuscular   Manufacturer: [employer], [employer]   Lot: [id number]   NDC: [address]-1

## 2020-01-07 ENCOUNTER — Ambulatory Visit: Payer: 59 | Attending: Internal Medicine

## 2020-01-07 DIAGNOSIS — Z23 Encounter for immunization: Secondary | ICD-10-CM

## 2020-01-07 NOTE — Progress Notes (Signed)
   Covid-19 Vaccination Clinic  Name:  Mallory Freeman    MRN: [id number] DOB: [birth year]  01/07/2020  [name] was observed post Covid-19 immunization for 15 minutes without incident. She was provided with Vaccine Information Sheet and instruction to access the V-Safe system.   [name] was instructed to call 911 with any severe reactions post vaccine: Marland Kitchen Difficulty breathing  . Swelling of face and throat  . A fast heartbeat  . A bad rash all over body  . Dizziness and weakness   Immunizations Administered    Name Date Dose VIS Date Route   Pfizer COVID-19 Vaccine 01/07/2020 10:17 AM 0.3 mL 09/28/2019 Intramuscular   Manufacturer: [employer], [employer]   Lot: [id number]   NDC: [address]-4

## 2020-01-24 DIAGNOSIS — R8290 Unspecified abnormal findings in urine: Secondary | ICD-10-CM | POA: Diagnosis not present

## 2020-01-24 DIAGNOSIS — N898 Other specified noninflammatory disorders of vagina: Secondary | ICD-10-CM | POA: Diagnosis not present

## 2020-01-24 DIAGNOSIS — N760 Acute vaginitis: Secondary | ICD-10-CM | POA: Diagnosis not present

## 2020-01-30 DIAGNOSIS — Z135 Encounter for screening for eye and ear disorders: Secondary | ICD-10-CM | POA: Diagnosis not present

## 2020-01-30 DIAGNOSIS — H5203 Hypermetropia, bilateral: Secondary | ICD-10-CM | POA: Diagnosis not present

## 2020-04-17 DIAGNOSIS — N760 Acute vaginitis: Secondary | ICD-10-CM | POA: Diagnosis not present

## 2020-04-17 DIAGNOSIS — N898 Other specified noninflammatory disorders of vagina: Secondary | ICD-10-CM | POA: Diagnosis not present

## 2020-08-22 ENCOUNTER — Other Ambulatory Visit: Payer: 59

## 2020-08-22 DIAGNOSIS — Z20822 Contact with and (suspected) exposure to covid-19: Secondary | ICD-10-CM | POA: Diagnosis not present

## 2020-08-23 LAB — SARS-COV-2, NAA 2 DAY TAT

## 2020-08-23 LAB — NOVEL CORONAVIRUS, NAA: SARS-CoV-2, NAA: NOT DETECTED

## 2020-09-02 DIAGNOSIS — Z1159 Encounter for screening for other viral diseases: Secondary | ICD-10-CM | POA: Diagnosis not present

## 2020-09-02 DIAGNOSIS — Z113 Encounter for screening for infections with a predominantly sexual mode of transmission: Secondary | ICD-10-CM | POA: Diagnosis not present

## 2020-09-02 DIAGNOSIS — Z114 Encounter for screening for human immunodeficiency virus [HIV]: Secondary | ICD-10-CM | POA: Diagnosis not present

## 2020-09-02 DIAGNOSIS — N898 Other specified noninflammatory disorders of vagina: Secondary | ICD-10-CM | POA: Diagnosis not present

## 2020-09-02 DIAGNOSIS — Z01419 Encounter for gynecological examination (general) (routine) without abnormal findings: Secondary | ICD-10-CM | POA: Diagnosis not present

## 2020-09-02 DIAGNOSIS — A630 Anogenital (venereal) warts: Secondary | ICD-10-CM | POA: Diagnosis not present

## 2020-09-08 DIAGNOSIS — L519 Erythema multiforme, unspecified: Secondary | ICD-10-CM | POA: Diagnosis not present

## 2020-09-25 DIAGNOSIS — Z021 Encounter for pre-employment examination: Secondary | ICD-10-CM | POA: Diagnosis not present

## 2020-09-25 DIAGNOSIS — Z111 Encounter for screening for respiratory tuberculosis: Secondary | ICD-10-CM | POA: Diagnosis not present

## 2020-10-20 DIAGNOSIS — Z20822 Contact with and (suspected) exposure to covid-19: Secondary | ICD-10-CM | POA: Diagnosis not present

## 2020-10-21 DIAGNOSIS — Z20822 Contact with and (suspected) exposure to covid-19: Secondary | ICD-10-CM | POA: Diagnosis not present

## 2020-10-21 DIAGNOSIS — Z1152 Encounter for screening for COVID-19: Secondary | ICD-10-CM | POA: Diagnosis not present

## 2020-10-22 ENCOUNTER — Other Ambulatory Visit: Payer: Self-pay

## 2020-10-22 ENCOUNTER — Emergency Department
Admission: EM | Admit: 2020-10-22 | Discharge: 2020-10-22 | Disposition: A | Discharge status: Home / Self Care | Payer: 59 | Source: Home / Self Care

## 2020-10-22 DIAGNOSIS — H1033 Unspecified acute conjunctivitis, bilateral: Secondary | ICD-10-CM | POA: Diagnosis not present

## 2020-10-22 DIAGNOSIS — J069 Acute upper respiratory infection, unspecified: Secondary | ICD-10-CM | POA: Diagnosis not present

## 2020-10-22 MED ORDER — PREDNISONE 20 MG PO TABS: 40.0000 mg | ORAL_TABLET | Freq: Every day | ORAL | 0 refills | Status: DC

## 2020-10-22 MED ORDER — ERYTHROMYCIN 5 MG/GM OP OINT: TOPICAL_OINTMENT | OPHTHALMIC | 0 refills | Status: DC

## 2020-10-22 MED ORDER — BENZONATATE 100 MG PO CAPS: 100.0000 mg | ORAL_CAPSULE | Freq: Three times a day (TID) | ORAL | 0 refills | Status: DC

## 2020-10-22 NOTE — ED Provider Notes (Signed)
[facility] CSN: [id number] Arrival date & time: 10/22/20  1116    History   Chief Complaint Chief Complaint  Patient presents with  . Cough  . Nasal Congestion  . Conjunctivitis    HPI Mallory Freeman is a 25 y.o. female.   HPI Patient presents with URI symptoms including cough, irritated eyes, fever. 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 Fever x 1 day ago, TMAX 101.4. Covid vaccinated and boosted. Denies worrisome symptoms of shortness of breath, weakness, N&V, chest pain. Past Medical History:  Diagnosis Date  . Anemia   . Asthma   . GAD (generalized anxiety disorder)   . Genital warts   . HSV infection     Patient Active Problem List   Diagnosis Date Noted  . GAD (generalized anxiety disorder) 03/24/2017  . HSV infection 03/24/2017  . Genital warts 03/24/2017  . Alcohol use 03/24/2017    History reviewed. No pertinent surgical history.  OB History    Gravida  0   Para      Term      Preterm      AB      Living        SAB      IAB      Ectopic      Multiple      Live Births               Home Medications    Prior to Admission medications   Medication Sig Start Date End Date Taking? Authorizing Provider  albuterol (VENTOLIN HFA) 108 (90 Base) MCG/ACT inhaler Inhale 2 puffs into the lungs every 6 (six) hours as needed. 03/29/19   Terressa Koyanagi, DO    Family History Family History  Problem Relation Age of Onset  . Hypertension Mother   . Cancer Maternal Grandmother        lymphoma  . Cancer Maternal Grandfather        prostate  . Diabetes Maternal Grandfather   . Breast cancer Other        Maternal Great Aunts and GreatGreat GM    Social History Social History   Tobacco Use  . Smoking status: Never Smoker  . Smokeless tobacco: Never Used  Substance Use Topics  . Alcohol use: No  . Drug use: No   Allergies   Patient has no known allergies. Review of Systems Review of Systems Pertinent  negatives listed in HPI Physical Exam Triage Vital Signs ED Triage Vitals [10/22/20 1223]  Enc Vitals Group     BP (!) 127/91     Pulse Rate (!) 105     Resp 18     Temp 98.2 F (36.8 C)     Temp Source Oral     SpO2 100 %     Weight      Height      Head Circumference      Peak Flow      Pain Score 1     Pain Loc      Pain Edu?      Excl. in GC?    No data found.  Updated Vital Signs BP (!) 127/91 (BP Location: Right Arm)   Pulse (!) 105   Temp 98.2 F (36.8 C) (Oral)   Resp 18   LMP 10/18/2020   SpO2 100%   Visual Acuity Right Eye Distance:   Left Eye Distance:   Bilateral Distance:    Right Eye Near:  Left Eye Near:    Bilateral Near:     Physical Exam  General Appearance:    Alert, cooperative, no distress  HENT:   Normocephalic, ears normal, nares mucosal edema with congestion, rhinorrhea, oropharynx    Eyes:    PERRL, conjunctiva erythematous with exudate, EOM's intact       Lungs:     Clear to auscultation bilaterally, respirations unlabored  Heart:    Regular rate and rhythm  Neurologic:   Awake, alert, oriented x 3. No apparent focal neurological           defect.      UC Treatments / Results  Labs (all labs ordered are listed, but only abnormal results are displayed) Labs Reviewed - No data to display  EKG   Radiology No results found.  Procedures Procedures (including critical care time)  Medications Ordered in UC Medications - No data to display  Initial Impression / Assessment and Plan / UC Course  I have reviewed the triage vital signs and the nursing notes.  Pertinent labs & imaging results that were available during my care of the patient were reviewed by me and considered in my medical decision making (see chart for details).      COVID/Flu test pending. Symptom management warranted only.  Manage fever with Tylenol and ibuprofen.  Nasal symptoms with over-the-counter antihistamines recommended.  Treatment per discharge  medications/discharge instructions.  Red flags/ER precautions given. The most current CDC isolation/quarantine recommendation advised.  Final Clinical Impressions(s) / UC Diagnoses   Final diagnoses:  Acute bacterial conjunctivitis of both eyes  Viral upper respiratory illness     Discharge Instructions     Your COVID 19 results will be available in 3-5 days. Negative results are immediately resulted to Mychart. Positive results will receive a follow-up call from our clinic. If symptoms are present, I recommend home quarantine until results are known.     ED Prescriptions    Medication Sig Dispense Auth. Provider   predniSONE (DELTASONE) 20 MG tablet Take 2 tablets (40 mg total) by mouth daily with breakfast. 10 tablet Bing Neighbors, FNP   benzonatate (TESSALON) 100 MG capsule Take 1-2 capsules (100-200 mg total) by mouth every 8 (eight) hours. 30 capsule Bing Neighbors, FNP   erythromycin ophthalmic ointment Place a 1/2 inch ribbon of ointment into the lower eyelid both eyes x 5 days at bedtime 3.5 g Bing Neighbors, FNP     PDMP not reviewed this encounter.   Bing Neighbors, FNP 10/27/20 2030

## 2020-10-22 NOTE — Discharge Instructions (Addendum)
Your COVID 19 results will be available in 3-5 days. Negative results are immediately resulted to Mychart. Positive results will receive a follow-up call from our clinic. If symptoms are present, I recommend home quarantine until results are known.  °

## 2020-10-22 NOTE — ED Triage Notes (Signed)
Pt c/o cough, runny nose that started Friday. Did receive booster last Thurs. Fever on Monday (101.4). No known covid exposure. Received a covid test from her school, came back neg today.

## 2020-10-24 LAB — COVID-19, FLU A+B NAA
Influenza A, NAA: NOT DETECTED
Influenza B, NAA: NOT DETECTED
SARS-CoV-2, NAA: DETECTED — AB

## 2020-11-11 DIAGNOSIS — R8290 Unspecified abnormal findings in urine: Secondary | ICD-10-CM | POA: Diagnosis not present

## 2020-11-11 DIAGNOSIS — N898 Other specified noninflammatory disorders of vagina: Secondary | ICD-10-CM | POA: Diagnosis not present

## 2020-12-01 DIAGNOSIS — R8290 Unspecified abnormal findings in urine: Secondary | ICD-10-CM | POA: Diagnosis not present

## 2020-12-01 DIAGNOSIS — N898 Other specified noninflammatory disorders of vagina: Secondary | ICD-10-CM | POA: Diagnosis not present

## 2020-12-29 DIAGNOSIS — F4322 Adjustment disorder with anxiety: Secondary | ICD-10-CM | POA: Diagnosis not present

## 2021-01-08 DIAGNOSIS — Z87440 Personal history of urinary (tract) infections: Secondary | ICD-10-CM | POA: Diagnosis not present

## 2021-01-27 DIAGNOSIS — Z20822 Contact with and (suspected) exposure to covid-19: Secondary | ICD-10-CM | POA: Diagnosis not present

## 2021-01-27 DIAGNOSIS — F4322 Adjustment disorder with anxiety: Secondary | ICD-10-CM | POA: Diagnosis not present

## 2021-02-06 DIAGNOSIS — F4322 Adjustment disorder with anxiety: Secondary | ICD-10-CM | POA: Diagnosis not present

## 2021-02-13 DIAGNOSIS — F4322 Adjustment disorder with anxiety: Secondary | ICD-10-CM | POA: Diagnosis not present

## 2021-02-20 DIAGNOSIS — F4322 Adjustment disorder with anxiety: Secondary | ICD-10-CM | POA: Diagnosis not present

## 2021-02-26 ENCOUNTER — Telehealth: Payer: 59 | Admitting: Physician Assistant

## 2021-02-26 DIAGNOSIS — R059 Cough, unspecified: Secondary | ICD-10-CM

## 2021-02-26 MED ORDER — ALBUTEROL SULFATE HFA 108 (90 BASE) MCG/ACT IN AERS
2.0000 | INHALATION_SPRAY | Freq: Four times a day (QID) | RESPIRATORY_TRACT | 3 refills | Status: DC | PRN
Start: 2021-02-26 — End: 2021-05-07

## 2021-02-26 MED ORDER — BENZONATATE 100 MG PO CAPS: 100.0000 mg | ORAL_CAPSULE | Freq: Three times a day (TID) | ORAL | 0 refills | Status: DC

## 2021-02-26 NOTE — Progress Notes (Signed)
Your current symptoms could be consistent with the coronavirus.  Many health care providers can now test patients at their office but not all are.  [facility] has multiple testing sites. For information on our COVID testing locations and hours go to [email or web address]  We are enrolling you in our MyChart Home Monitoring for COVID19 . Daily you will receive a questionnaire within the MyChart website. Our COVID 19 response team will be monitoring your responses daily.  Testing Information: The COVID-19 Community Testing sites are testing BY APPOINTMENT ONLY.  You can schedule online at [email or web address]  If you do not have access to a smart phone or computer you may call [phone] for an appointment.   Additional testing sites in the Community:  . For [facility] Testing sites in [facility]  [email or web address].[email or web address]  . For Pop-up testing sites in [place]  [email or web address]  . For [facility] [email or web address]  . For [facility] testing in [place] and [employer] [email or web address]  . For [facility] testing in [facility]   [email or web address]  For  more information about community testing call [phone]   Please quarantine yourself while awaiting your test results. Please stay home for a minimum of 10 days from the first day of illness with improving symptoms and you have had 24 hours of no fever (without the use of Tylenol (Acetaminophen) Motrin (Ibuprofen) or any fever reducing medication).  Also - Do not get tested prior to returning to work because once you have had a positive test the test can stay positive for more than a month in some  cases.   You should wear a mask or cloth face covering over your nose and mouth if you must be around other people or animals, including pets (even at home). Try to stay at least 6 feet away from other people. This will protect the people around you.  Please continue good preventive care measures, including:  frequent hand-washing, avoid touching your face, cover coughs/sneezes, stay out of crowds and keep a 6 foot distance from others.  COVID-19 is a respiratory illness with symptoms that are similar to the flu. Symptoms are typically mild to moderate, but there have been cases of severe illness and death due to the virus.   The following symptoms may appear 2-14 days after exposure: . Fever . Cough . Shortness of breath or difficulty breathing . Chills . Repeated shaking with chills . Muscle pain . Headache . Sore throat . New loss of taste or smell . Fatigue . Congestion or runny nose . Nausea or vomiting . Diarrhea  Go to the nearest hospital ED for assessment if fever/cough/breathlessness are severe or illness seems like a threat to life.  It is vitally important that if you feel that you have an infection such as this virus or any other virus that you stay home and away from places where you may spread it to others.  You should avoid contact with people age 58 and older.   You can use medication such as prescription cough medication called Tessalon Perles 100 mg. You may take 1-2 capsules every 8 hours as needed for cough and  prescription inhaler called Albuterol MDI 90 mcg /actuation 2 puffs every 4 hours as needed for shortness of breath, wheezing, cough  You may also take acetaminophen (Tylenol) as needed for fever.  Reduce your risk of any infection by using the same precautions used for avoiding the common cold or flu:  Marland Kitchen Wash your hands often with  soap and warm water for at least 20 seconds.  If soap and water are not readily available, use an alcohol-based hand sanitizer with  at least 60% alcohol.  . If coughing or sneezing, cover your mouth and nose by coughing or sneezing into the elbow areas of your shirt or coat, into a tissue or into your sleeve (not your hands). . Avoid shaking hands with others and consider head nods or verbal greetings only. . Avoid touching your eyes, nose, or mouth with unwashed hands.  . Avoid close contact with people who are sick. . Avoid places or events with large numbers of people in one location, like concerts or sporting events. . Carefully consider travel plans you have or are making. . If you are planning any travel outside or inside the [place], visit the CDC's Travelers' Health webpage for the latest health notices. . If you have some symptoms but not all symptoms, continue to monitor at home and seek medical attention if your symptoms worsen. . If you are having a medical emergency, call 911.  HOME CARE . Only take medications as instructed by your medical team. . Drink plenty of fluids and get plenty of rest. . A steam or ultrasonic humidifier can help if you have congestion.   GET HELP RIGHT AWAY IF YOU HAVE EMERGENCY WARNING SIGNS** FOR COVID-19. If you or someone is showing any of these signs seek emergency medical care immediately. Call 911 or proceed to your closest emergency facility if: . You develop worsening high fever. . Trouble breathing . Bluish lips or face . Persistent pain or pressure in the chest . New confusion . Inability to wake or stay awake . You cough up blood. . Your symptoms become more severe  **This list is not all possible symptoms. Contact your medical provider for any symptoms that are sever or concerning to you.  MAKE SURE YOU   Understand these instructions.  Will watch your condition.  Will get help right away if you are not doing well or get worse.  Your e-visit answers were reviewed by a board certified advanced clinical practitioner to complete your personal care plan.  Depending on  the condition, your plan could have included both over the counter or prescription medications.  If there is a problem please reply once you have received a response from your provider.  Your safety is important to us.  If you have drug allergies check your prescription carefully.    You can use MyChart to ask questions about today's visit, request a non-urgent call back, or ask for a work or school excuse for 24 hours related to this e-Visit. If it has been greater than 24 hours you will need to follow up with your provider, or enter a new e-Visit to address those concerns. You will get an e-mail in the next two days asking about your experience.  I hope that your e-visit has been valuable and will speed your recovery. Thank you for using e-visits.  I provided 5 minutes of non face-to-face time during this encounter for chart review and documentation.   

## 2021-02-27 DIAGNOSIS — Z135 Encounter for screening for eye and ear disorders: Secondary | ICD-10-CM | POA: Diagnosis not present

## 2021-02-27 DIAGNOSIS — Z0100 Encounter for examination of eyes and vision without abnormal findings: Secondary | ICD-10-CM | POA: Diagnosis not present

## 2021-03-03 DIAGNOSIS — F4322 Adjustment disorder with anxiety: Secondary | ICD-10-CM | POA: Diagnosis not present

## 2021-04-10 DIAGNOSIS — F4322 Adjustment disorder with anxiety: Secondary | ICD-10-CM | POA: Diagnosis not present

## 2021-05-07 ENCOUNTER — Telehealth: Payer: 59 | Admitting: Physician Assistant

## 2021-05-07 DIAGNOSIS — Z20822 Contact with and (suspected) exposure to covid-19: Secondary | ICD-10-CM | POA: Diagnosis not present

## 2021-05-07 DIAGNOSIS — R300 Dysuria: Secondary | ICD-10-CM | POA: Diagnosis not present

## 2021-05-07 MED ORDER — CEPHALEXIN 500 MG PO CAPS: 500.0000 mg | ORAL_CAPSULE | Freq: Two times a day (BID) | ORAL | 0 refills | Status: AC

## 2021-05-07 NOTE — Progress Notes (Signed)
I have spent 5 minutes in review of e-visit questionnaire, review and updating patient chart, medical decision making and response to patient.   Maxen Rowland Cody Megumi Treaster, PA-C    

## 2021-05-07 NOTE — Progress Notes (Signed)

## 2021-11-12 ENCOUNTER — Other Ambulatory Visit: Payer: Self-pay

## 2021-11-12 ENCOUNTER — Encounter: Payer: Self-pay | Admitting: Nurse Practitioner

## 2021-11-12 ENCOUNTER — Ambulatory Visit: Payer: BC Managed Care – PPO | Admitting: Nurse Practitioner

## 2021-11-12 VITALS — BP 116/74 | HR 99 | Temp 97.7°F | Resp 18 | Wt 161.4 lb

## 2021-11-12 DIAGNOSIS — R1013 Epigastric pain: Secondary | ICD-10-CM

## 2021-11-12 DIAGNOSIS — K5904 Chronic idiopathic constipation: Secondary | ICD-10-CM

## 2021-11-12 MED ORDER — FAMOTIDINE 20 MG PO TABS: 20.0000 mg | ORAL_TABLET | Freq: Every day | ORAL | 2 refills | Status: DC

## 2021-11-12 MED ORDER — SENNA-DOCUSATE SODIUM 8.6-50 MG PO TABS
1.0000 | ORAL_TABLET | Freq: Every day | ORAL | 1 refills | Status: DC
Start: 2021-11-12 — End: 2021-11-26

## 2021-11-12 MED ORDER — PANTOPRAZOLE SODIUM 20 MG PO TBEC
20.0000 mg | DELAYED_RELEASE_TABLET | Freq: Every day | ORAL | 0 refills | Status: DC
Start: 2021-11-12 — End: 2022-02-17

## 2021-11-12 NOTE — Progress Notes (Signed)
Subjective:  Patient ID: Mallory Freeman, female    DOB: 04-01-1996  Age: 26 y.o. MRN: [id number]  CC: Establish Care (Pt would like to have blood work, and GI exam pt states she has had GI issues for 3-4 yrs. Pt states he will randomly throws up after experiencing nausea. 1-2 a week in the last month. )  HPI  Chronic idiopathic constipation Onset >71yrs ago. Associated with bloating and Intermittent liquid stool 1x/week Has 1BM per week, stool is firm and dry. No OTC medication used. Has not made any dietary modifications  Advised about need for high fiber diet and adequate oral hydration Start senna-S 1-2tabs daily   Dyspepsia Chronic, onset 67yrs ago, waxing and waning Associated with Nausea and vomiting, cough, post nasal drainage, sour taste in mouth, Early satiety ETOH use liquor: 1-4shot 2x/week No tobacco or caffeine use Marijuana use:1-2blunts per day since 2014.  Check H-pylori: negative Eliminate marijuana and ETOH use. Increase oral hydration (70oz of water per day) Maintain high fiber diet. Start omeprazole and famotidine F/up in 11month  Reviewed past Medical, Social and Family history today.  No outpatient medications prior to visit.   No facility-administered medications prior to visit.   ROS See HPI  Objective:  LMP 10/31/2021 (Exact Date)   Physical Exam Cardiovascular:     Rate and Rhythm: Normal rate.     Pulses: Normal pulses.  Pulmonary:     Effort: Pulmonary effort is normal.  Abdominal:     General: Bowel sounds are normal. There is no distension.     Palpations: Abdomen is soft.     Tenderness: There is no abdominal tenderness. There is no guarding.  Neurological:     Mental Status: She is alert and oriented to person, place, and time.   Assessment & Plan:  This visit occurred during the SARS-CoV-2 public health emergency.  Safety protocols were in place, including screening questions prior to the visit, additional usage of staff  PPE, and extensive cleaning of exam room while observing appropriate contact time as indicated for disinfecting solutions.   Mallory Freeman was seen today for establish care.  Diagnoses and all orders for this visit:  Dyspepsia -     pantoprazole (PROTONIX) 20 MG tablet; Take 1 tablet (20 mg total) by mouth daily before breakfast. -     famotidine (PEPCID) 20 MG tablet; Take 1 tablet (20 mg total) by mouth at bedtime. -     H. pylori breath test  Chronic idiopathic constipation -     sennosides-docusate sodium (SENOKOT-S) 8.6-50 MG tablet; Take 1 tablet by mouth at bedtime.   Problem List Items Addressed This Visit       Digestive   Chronic idiopathic constipation    Onset >50yrs ago. Associated with bloating and Intermittent liquid stool 1x/week Has 1BM per week, stool is firm and dry. No OTC medication used. Has not made any dietary modifications  Advised about need for high fiber diet and adequate oral hydration Start senna-S 1-2tabs daily       Relevant Medications   sennosides-docusate sodium (SENOKOT-S) 8.6-50 MG tablet     Other   Dyspepsia - Primary    Chronic, onset 67yrs ago, waxing and waning Associated with Nausea and vomiting, cough, post nasal drainage, sour taste in mouth, Early satiety ETOH use liquor: 1-4shot 2x/week No tobacco or caffeine use Marijuana use:1-2blunts per day since 2014.  Check H-pylori: negative Eliminate marijuana and ETOH use. Increase oral hydration (70oz of water per day)  Maintain high fiber diet. Start omeprazole and famotidine F/up in 67month      Relevant Medications   pantoprazole (PROTONIX) 20 MG tablet   famotidine (PEPCID) 20 MG tablet   Other Relevant Orders   H. pylori breath test (Completed)    Follow-up: Return in about 4 weeks (around 12/10/2021) for CPE (fasting).  Alysia Penna, NP

## 2021-11-12 NOTE — Patient Instructions (Addendum)
Thank you for choosing [facility]  Go to lab for blood draw  Eliminate marijuana and ETOH use.  Increase oral hydration (70oz of water per day) Maintain high fiber diet.  Diet for Irritable Bowel Syndrome When you have irritable bowel syndrome (IBS), it is very important to eat the foods and follow the eating habits that are best for your condition. IBS may cause various symptoms such as pain in the abdomen, constipation, or diarrhea. Choosing the right foods can help to ease the discomfort from these symptoms. Work with your health care provider and diet and nutrition specialist (dietitian) to find the eating plan that will help to control your symptoms. What are tips for following this plan?   Keep a food diary. This will help you identify foods that cause symptoms. Write down: What you eat and when you eat it. What symptoms you have. When symptoms occur in relation to your meals, such as "pain in abdomen 2 hours after dinner." Eat your meals slowly and in a relaxed setting. Aim to eat 5-6 small meals per day. Do not skip meals. Drink enough fluid to keep your urine pale yellow. Ask your health care provider if you should take an over-the-counter probiotic to help restore healthy bacteria in your gut (digestive tract). Probiotics are foods that contain good bacteria and yeasts. Your dietitian may have specific dietary recommendations for you based on your symptoms. He or she may recommend that you: Avoid foods that cause symptoms. Talk with your dietitian about other ways to get the same nutrients that are in those problem foods. Avoid foods with gluten. Gluten is a protein that is found in rye, wheat, and barley. Eat more foods that contain soluble fiber. Examples of foods with high soluble fiber include oats, seeds, and certain fruits and vegetables. Take a fiber supplement if directed by your dietitian. Reduce or avoid certain foods called FODMAPs. These are foods that  contain carbohydrates that are hard to digest. Ask your doctor which foods contain these carbohydrates. What foods are not recommended? The following are some foods and drinks that may make your symptoms worse: Fatty foods, such as french fries. Foods that contain gluten, such as pasta and cereal. Dairy products, such as milk, cheese, and ice cream. Chocolate. Alcohol. Products with caffeine, such as coffee. Carbonated drinks, such as soda. Foods that are high in FODMAPs. These include certain fruits and vegetables. Products with sweeteners such as honey, high fructose corn syrup, sorbitol, and mannitol. The items listed above may not be a complete list of foods and beverages you should avoid. Contact a dietitian for more information. What foods are good sources of fiber? Your health care provider or dietitian may recommend that you eat more foods that contain fiber. Fiber can help to reduce constipation and other IBS symptoms. Add foods with fiber to your diet a little at a time so your body can get used to them. Too much fiber at one time might cause gas and swelling of your abdomen. The following are some foods that are good sources of fiber: Berries, such as raspberries, strawberries, and blueberries. Tomatoes. Carrots. Brown rice. Oats. Seeds, such as chia and pumpkin seeds. The items listed above may not be a complete list of recommended sources of fiber. Contact your dietitian for more options. Where to find more information International Foundation for Functional Gastrointestinal Disorders: www.iffgd.[employer] of Diabetes and Digestive and Kidney Diseases: CarFli[employer].tn Summary When you have irritable bowel syndrome (IBS), it is  very important to eat the foods and follow the eating habits that are best for your condition. IBS may cause various symptoms such as pain in the abdomen, constipation, or diarrhea. Choosing the right foods can help to ease the discomfort  that comes from symptoms. Keep a food diary. This will help you identify foods that cause symptoms. Your health care provider or diet and nutrition specialist (dietitian) may recommend that you eat more foods that contain fiber. This information is not intended to replace advice given to you by your health care provider. Make sure you discuss any questions you have with your health care provider. Document Revised: 05/29/2020 Document Reviewed: 06/05/2020 Elsevier Patient Education  2022 [employer].

## 2021-11-13 ENCOUNTER — Telehealth: Payer: Self-pay | Admitting: Nurse Practitioner

## 2021-11-13 LAB — H. PYLORI BREATH TEST: H. pylori Breath Test: NOT DETECTED

## 2021-11-16 ENCOUNTER — Encounter: Payer: Self-pay | Admitting: Nurse Practitioner

## 2021-11-16 DIAGNOSIS — N920 Excessive and frequent menstruation with regular cycle: Secondary | ICD-10-CM | POA: Insufficient documentation

## 2021-11-16 DIAGNOSIS — R1013 Epigastric pain: Secondary | ICD-10-CM | POA: Insufficient documentation

## 2021-11-16 DIAGNOSIS — K5904 Chronic idiopathic constipation: Secondary | ICD-10-CM | POA: Insufficient documentation

## 2021-11-16 NOTE — Assessment & Plan Note (Addendum)
Chronic, onset 7yrs ago, waxing and waning Associated with Nausea and vomiting, cough, post nasal drainage, sour taste in mouth, Early satiety ETOH use liquor: 1-4shot 2x/week No tobacco or caffeine use Marijuana use:1-2blunts per day since 2014.  Check H-pylori: negative Eliminate marijuana and ETOH use. Increase oral hydration (70oz of water per day) Maintain high fiber diet. Start omeprazole and famotidine F/up in 39month

## 2021-11-16 NOTE — Telephone Encounter (Signed)
error 

## 2021-11-16 NOTE — Assessment & Plan Note (Signed)
Onset >35yrs ago. Associated with bloating and Intermittent liquid stool 1x/week Has 1BM per week, stool is firm and dry. No OTC medication used. Has not made any dietary modifications  Advised about need for high fiber diet and adequate oral hydration Start senna-S 1-2tabs daily

## 2021-11-25 ENCOUNTER — Other Ambulatory Visit: Payer: Self-pay

## 2021-11-26 ENCOUNTER — Ambulatory Visit: Payer: BC Managed Care – PPO | Admitting: Nurse Practitioner

## 2021-11-26 ENCOUNTER — Encounter: Payer: Self-pay | Admitting: Nurse Practitioner

## 2021-11-26 VITALS — BP 100/64 | HR 80 | Temp 96.5°F | Ht 64.5 in | Wt 153.6 lb

## 2021-11-26 DIAGNOSIS — R8290 Unspecified abnormal findings in urine: Secondary | ICD-10-CM | POA: Diagnosis not present

## 2021-11-26 DIAGNOSIS — K5904 Chronic idiopathic constipation: Secondary | ICD-10-CM | POA: Diagnosis not present

## 2021-11-26 LAB — POCT URINALYSIS DIPSTICK
Bilirubin, UA: NEGATIVE
Glucose, UA: NEGATIVE
Ketones, UA: 5
Nitrite, UA: NEGATIVE
Protein, UA: POSITIVE — AB
Spec Grav, UA: >=1.03 — AB (ref 1.010–1.025)
Urobilinogen, UA: 1 E.U./dL
pH, UA: 6 (ref 5.0–8.0)

## 2021-11-26 MED ORDER — CEPHALEXIN 500 MG PO CAPS: 500.0000 mg | ORAL_CAPSULE | Freq: Three times a day (TID) | ORAL | 0 refills | Status: DC

## 2021-11-26 MED ORDER — SENNA-DOCUSATE SODIUM 8.6-50 MG PO TABS: 1.0000 | ORAL_TABLET | Freq: Every day | ORAL | 1 refills | Status: DC

## 2021-11-26 NOTE — Progress Notes (Signed)
Subjective:  Patient ID: Mallory Freeman, female    DOB: [birth year]  Age: 26 y.o. MRN: [id number]  CC: Acute Visit (Pt c/o possible UTI. Pt states she has a odor and normally when she has a odor it is due to uti. Denies burning, itching, or pain. )  Urinary Tract Infection  This is a recurrent problem. The current episode started in the past 7 days. The problem occurs every urination. The problem has been unchanged. The pain is at a severity of 0/10. The patient is experiencing no pain. There has been no fever. She is Sexually active. There is No history of pyelonephritis. Associated symptoms include a discharge. Pertinent negatives include no chills, flank pain, frequency, hematuria, hesitancy, nausea, possible pregnancy, sweats, urgency or vomiting. She has tried nothing for the symptoms. Her past medical history is significant for recurrent UTIs.  Hx of constipation. Did not start senokot as previously prescribed. Hx of recurrent BV before and after menstrual cycle, use of metronidazole prn  Reviewed past Medical, Social and Family history today.  Outpatient Medications Prior to Visit  Medication Sig Dispense Refill   famotidine (PEPCID) 20 MG tablet Take 1 tablet (20 mg total) by mouth at bedtime. 30 tablet 2   pantoprazole (PROTONIX) 20 MG tablet Take 1 tablet (20 mg total) by mouth daily before breakfast. 90 tablet 0   sennosides-docusate sodium (SENOKOT-S) 8.6-50 MG tablet Take 1 tablet by mouth at bedtime. (Patient not taking: Reported on 11/26/2021) 90 tablet 1   No facility-administered medications prior to visit.   ROS See HPI  Objective:  BP 100/64 (BP Location: Left Arm, Patient Position: Sitting, Cuff Size: Normal)    Pulse 80    Temp (!) 96.5 F (35.8 C) (Temporal)    Ht 5' 4.5" (1.638 m)    Wt 153 lb 9.6 oz (69.7 kg)    LMP 10/31/2021 (Exact Date)    SpO2 100%    BMI 25.96 kg/m   Physical Exam Vitals reviewed.  Constitutional:      General: She is not in acute  distress. Cardiovascular:     Rate and Rhythm: Normal rate.     Pulses: Normal pulses.  Pulmonary:     Effort: Pulmonary effort is normal.  Abdominal:     General: There is no distension.     Tenderness: There is no abdominal tenderness.  Neurological:     Mental Status: She is alert and oriented to person, place, and time.   Assessment & Plan:  This visit occurred during the SARS-CoV-2 public health emergency.  Safety protocols were in place, including screening questions prior to the visit, additional usage of staff PPE, and extensive cleaning of exam room while observing appropriate contact time as indicated for disinfecting solutions.   Miles was seen today for acute visit.  Diagnoses and all orders for this visit:  Abnormal urine odor -     POCT urinalysis dipstick -     Urine Culture -     cephALEXin (KEFLEX) 500 MG capsule; Take 1 capsule (500 mg total) by mouth 3 (three) times daily.  Chronic idiopathic constipation -     sennosides-docusate sodium (SENOKOT-S) 8.6-50 MG tablet; Take 1 tablet by mouth at bedtime.    Problem List Items Addressed This Visit       Digestive   Chronic idiopathic constipation   Relevant Medications   sennosides-docusate sodium (SENOKOT-S) 8.6-50 MG tablet   Other Visit Diagnoses     Abnormal urine odor    -  Primary   Relevant Medications   cephALEXin (KEFLEX) 500 MG capsule   Other Relevant Orders   POCT urinalysis dipstick (Completed)   Urine Culture       Follow-up: Return if symptoms worsen or fail to improve.  Wilfred Lacy, NP

## 2021-11-26 NOTE — Patient Instructions (Signed)
Urinalysis is none specific. Urine sent for culture Start keflex 500mg  TID x 3days while waiting for lab results  Start senokot for constipation.  Constipation, Adult Constipation is when a person has trouble pooping (having a bowel movement). When you have this condition, you may poop fewer than 3 times a week. Your poop (stool) may also be dry, hard, or bigger than normal. Follow these instructions at home: Eating and drinking  Eat foods that have a lot of fiber, such as: Fresh fruits and vegetables. Whole grains. Beans. Eat less of foods that are low in fiber and high in fat and sugar, such as: fries. Hamburgers. Cookies. Candy. Soda. Drink enough fluid to keep your pee (urine) pale yellow. General instructions Exercise regularly or as told by your doctor. Try to do 150 minutes of exercise each week. Go to the restroom when you feel like you need to poop. Do not hold it in. Take over-the-counter and prescription medicines only as told by your doctor. These include any fiber supplements. When you poop: Do deep breathing while relaxing your lower belly (abdomen). Relax your pelvic floor. The pelvic floor is a group of muscles that support the rectum, bladder, and intestines (as well as the uterus in women). Watch your condition for any changes. Tell your doctor if you notice any. Keep all follow-up visits as told by your doctor. This is important. Contact a doctor if: You have pain that gets worse. You have a fever. You have not pooped for 4 days. You vomit. You are not hungry. You lose weight. You are bleeding from the opening of the butt (anus). You have thin, pencil-like poop. Get help right away if: You have a fever, and your symptoms suddenly get worse. You leak poop or have blood in your poop. Your belly feels hard or bigger than normal (bloated). You have very bad belly pain. You feel dizzy or you faint. Summary Constipation is when a person poops fewer  than 3 times a week, has trouble pooping, or has poop that is dry, hard, or bigger than normal. Eat foods that have a lot of fiber. Drink enough fluid to keep your pee (urine) pale yellow. Take over-the-counter and prescription medicines only as told by your doctor. These include any fiber supplements. This information is not intended to replace advice given to you by your health care provider. Make sure you discuss any questions you have with your health care provider. Document Revised: 08/22/2019 Document Reviewed: 08/22/2019 Elsevier Patient Education  2022 2023.

## 2021-11-27 ENCOUNTER — Telehealth: Payer: BC Managed Care – PPO | Admitting: Nurse Practitioner

## 2021-11-28 LAB — URINE CULTURE
MICRO NUMBER:: 12987268
SPECIMEN QUALITY:: ADEQUATE

## 2021-11-28 MED ORDER — CEPHALEXIN 500 MG PO CAPS: 500.0000 mg | ORAL_CAPSULE | Freq: Three times a day (TID) | ORAL | 0 refills | Status: DC

## 2021-11-28 NOTE — Addendum Note (Signed)
Addended by: Wilfred Lacy L on: 11/28/2021 11:02 PM   Modules accepted: Orders

## 2021-12-31 ENCOUNTER — Ambulatory Visit: Payer: BC Managed Care – PPO | Admitting: Nurse Practitioner

## 2022-02-11 ENCOUNTER — Other Ambulatory Visit: Payer: Self-pay | Admitting: Nurse Practitioner

## 2022-02-11 DIAGNOSIS — R1013 Epigastric pain: Secondary | ICD-10-CM

## 2022-02-17 NOTE — Telephone Encounter (Signed)
Chart supports rx refill ?Last ov: 11/26/21 ?Last refill: 11/12/21 ? ?

## 2022-04-22 ENCOUNTER — Encounter: Payer: Self-pay | Admitting: Family Medicine

## 2022-04-22 ENCOUNTER — Ambulatory Visit: Payer: BC Managed Care – PPO | Admitting: Family Medicine

## 2022-04-22 VITALS — BP 116/78 | HR 72 | Temp 97.5°F | Wt 156.2 lb

## 2022-04-22 DIAGNOSIS — I889 Nonspecific lymphadenitis, unspecified: Secondary | ICD-10-CM

## 2022-04-22 DIAGNOSIS — S1096XA Insect bite of unspecified part of neck, initial encounter: Secondary | ICD-10-CM

## 2022-04-22 DIAGNOSIS — W57XXXA Bitten or stung by nonvenomous insect and other nonvenomous arthropods, initial encounter: Secondary | ICD-10-CM | POA: Diagnosis not present

## 2022-04-22 NOTE — Patient Instructions (Addendum)
We are referring you to [facility]

## 2022-04-22 NOTE — Progress Notes (Signed)
   Mallory Freeman is a 26 y.o. female who presents today for an office visit.  Assessment/Plan:  Insect bite Recommend supportive care with OTC remedies as needed Provided reassurance that this was not likely related to the previous lymphadenopathy  Spent 20 minutes with patient reviewing patient history and discussing case and possible outcomes Problem List Items Addressed This Visit       Immune and Lymphatic   Cervical lymphadenitis - Primary    Possible recurrence of cervical lymphadenopathy although this is on the left side Given return precautions discussed with surgery will place referral      Relevant Orders   Ambulatory referral to General Surgery   Other Visit Diagnoses     Insect bite of neck, initial encounter              Subjective:  HPI:  Mallory Freeman is a 26 y.o. female who has GAD (generalized anxiety disorder); HSV infection; Dyspepsia; Chronic idiopathic constipation; Menorrhagia; Proteinuria; and Cervical lymphadenitis on their problem list..   She  has a past medical history of Anemia, Asthma, GAD (generalized anxiety disorder), Genital warts, and HSV infection..   She presents with chief complaint of Bumps on neck  (Patient states that a biopsy was done in 2015 for the same area. Patient noticed a bite on the back of her neck on Monday. ) .   Patient with a history of right cervical lymphadenopathy.  She feels like she has a recurrence of this on the left side.  It is mildly tender when touched.  Status post biopsy in 2015.  This was done by [facility] surgery.  Work-up by pathology at that time was not conclusive for the type of lymphadenopathy, however testing was negative for lymphoma.  There was also question that it could be lupus, however this was also not felt to be likely.  But return precautions were discussed if they did recurrent.  Patient does also state she exhibits chills and some abdominal discomfort sometimes.  Patient  reports that she has a mildly tender bump on the back of her neck. She is concerned there is an insect bite.  It is red.  There has been no drainage from it.  It is only tender when touched.   It is not itchy.       Objective:  Physical Exam: BP 116/78 (BP Location: Left Arm, Patient Position: Sitting, Cuff Size: Large)   Pulse 72   Temp (!) 97.5 F (36.4 C) (Temporal)   Wt 156 lb 3.2 oz (70.9 kg)   LMP 04/14/2022   SpO2 98%   BMI 26.40 kg/m    General: No acute distress. Awake and conversant.  Eyes: Normal conjunctiva, anicteric. Round symmetric pupils.  ENT: There is a left neck nodule, mildly tender, approximately 7 mm in diameter, it is mobile, there is no overlying skin changes, nothing appreciated on the right side, on the nape of the neck right below the hairline, there is an erythematous raised lesion with what appears to be an insect bite, it is mildly tender, there is no drainage from Neck: Neck is supple. No masses or thyromegaly.  Respiratory: Respirations are non-labored. No auditory wheezing.  Skin: Warm. No rashes or ulcers.  Psych: Alert and oriented. Cooperative, Appropriate mood and affect, Normal judgment.  CV: No cyanosis or JVD MSK: Normal ambulation. No clubbing  Neuro: Sensation and CN II-XII grossly normal.        Garner Nash, MD, MS

## 2022-04-22 NOTE — Assessment & Plan Note (Signed)
Possible recurrence of cervical lymphadenopathy although this is on the left side Given return precautions discussed with surgery will place referral

## 2023-01-14 LAB — HM PAP SMEAR: HM Pap smear: NEGATIVE

## 2023-02-09 ENCOUNTER — Ambulatory Visit (INDEPENDENT_AMBULATORY_CARE_PROVIDER_SITE_OTHER): Payer: BC Managed Care – PPO | Admitting: Nurse Practitioner

## 2023-02-09 ENCOUNTER — Encounter: Payer: Self-pay | Admitting: Nurse Practitioner

## 2023-02-09 VITALS — BP 110/79 | HR 91 | Temp 98.0°F | Resp 16 | Ht 64.5 in | Wt 154.0 lb

## 2023-02-09 DIAGNOSIS — R81 Glycosuria: Secondary | ICD-10-CM

## 2023-02-09 DIAGNOSIS — R300 Dysuria: Secondary | ICD-10-CM

## 2023-02-09 DIAGNOSIS — N3000 Acute cystitis without hematuria: Secondary | ICD-10-CM

## 2023-02-09 DIAGNOSIS — R350 Frequency of micturition: Secondary | ICD-10-CM

## 2023-02-09 LAB — POCT URINALYSIS DIPSTICK
Bilirubin, UA: NEGATIVE
Blood, UA: NEGATIVE
Glucose, UA: POSITIVE — AB
Ketones, UA: NEGATIVE
Nitrite, UA: NEGATIVE
Protein, UA: NEGATIVE
Spec Grav, UA: >=1.03 — AB (ref 1.010–1.025)
Urobilinogen, UA: 0.2 E.U./dL
pH, UA: 6 (ref 5.0–8.0)

## 2023-02-09 LAB — GLUCOSE, POCT (MANUAL RESULT ENTRY): POC Glucose: 85 mg/dl (ref 70–99)

## 2023-02-09 MED ORDER — SULFAMETHOXAZOLE-TRIMETHOPRIM 800-160 MG PO TABS: 1.0000 | ORAL_TABLET | Freq: Two times a day (BID) | ORAL | 0 refills | Status: DC

## 2023-02-09 NOTE — Progress Notes (Addendum)
Acute Office Visit  Subjective:    Patient ID: MAHAILA TISCHER, female    DOB: April 04, 1996, 27 y.o.   MRN: [id number]  Chief Complaint  Patient presents with   Urinary Frequency    Pt states" She has frequency and malodorous urine. Took Flagyl- she thought it was bv    Urinary Frequency  This is a new problem. The current episode started in the past 7 days. The problem has been unchanged. The pain is at a severity of 0/10. The patient is experiencing no pain. There has been no fever. She is Sexually active. There is No history of pyelonephritis. Associated symptoms include frequency and urgency. Pertinent negatives include no chills, discharge, flank pain, hematuria, hesitancy, nausea, possible pregnancy, sweats or vomiting. She has tried nothing for the symptoms. Her past medical history is significant for recurrent UTIs. There is no history of catheterization, kidney stones, a single kidney, urinary stasis or a urological procedure.  No constipation or diarrhea, denies need for STI screen. LMP 01/20/2023, No contraception  Outpatient Medications Prior to Visit  Medication Sig   [DISCONTINUED] metroNIDAZOLE (FLAGYL) 500 MG tablet Take 500 mg by mouth 2 (two) times daily.   famotidine (PEPCID) 20 MG tablet TAKE 1 TABLET(20 MG) BY MOUTH AT BEDTIME (Patient not taking: Reported on 04/22/2022)   pantoprazole (PROTONIX) 20 MG tablet TAKE 1 TABLET(20 MG) BY MOUTH DAILY BEFORE BREAKFAST (Patient not taking: Reported on 04/22/2022)   sennosides-docusate sodium (SENOKOT-S) 8.6-50 MG tablet Take 1 tablet by mouth at bedtime. (Patient not taking: Reported on 04/22/2022)   [DISCONTINUED] cephALEXin (KEFLEX) 500 MG capsule Take 1 capsule (500 mg total) by mouth 3 (three) times daily. (Patient not taking: Reported on 04/22/2022)   No facility-administered medications prior to visit.   Reviewed past medical and social history.  Review of Systems  Constitutional:  Negative for chills.  Gastrointestinal:   Negative for nausea and vomiting.  Genitourinary:  Positive for frequency and urgency. Negative for flank pain, hematuria and hesitancy.   Per HPI     Objective:    Physical Exam Vitals and nursing note reviewed.  Abdominal:     General: There is no distension.     Palpations: Abdomen is soft.     Tenderness: There is no abdominal tenderness. There is no right CVA tenderness, left CVA tenderness or guarding.     BP 110/79 (BP Location: Left Arm, Patient Position: Sitting, Cuff Size: Large)   Pulse 91   Temp 98 F (36.7 C) (Tympanic)   Resp 16   Ht 5' 4.5" (1.638 m)   Wt 154 lb (69.9 kg)   LMP 01/20/2023 (Approximate)   SpO2 96%   BMI 26.03 kg/m    Results for orders placed or performed in visit on 02/09/23  Urine Culture   Specimen: Urine  Result Value Ref Range   MICRO NUMBER: [id number]    SPECIMEN QUALITY: Adequate    Sample Source URINE    STATUS: FINAL    ISOLATE 1: Proteus mirabilis (A)    ISOLATE 2: Escherichia coli (A)       Susceptibility   Escherichia coli - URINE CULTURE NEGATIVE 2    AMOX/CLAVULANIC 8 Sensitive     AMPICILLIN >=32 Resistant     AMPICILLIN/SULBACTAM 16 Intermediate     CEFAZOLIN 16 Resistant     CEFTAZIDIME <=1 Sensitive     CEFEPIME <=1 Sensitive     CEFTRIAXONE <=1 Sensitive     CIPROFLOXACIN <=0.25 Sensitive  LEVOFLOXACIN <=0.12 Sensitive     GENTAMICIN <=1 Sensitive     IMIPENEM <=0.25 Sensitive     NITROFURANTOIN <=16 Sensitive     PIP/TAZO <=4 Sensitive     TOBRAMYCIN <=1 Sensitive     TRIMETH/SULFA* <=20 Sensitive      * For infections other than uncomplicated UTI caused by E. coli, K. pneumoniae or P. mirabilis: Cefazolin is resistant if MIC > or = 8 mcg/mL. (Distinguishing susceptible versus intermediate for isolates with MIC < or = 4 mcg/mL requires additional testing.) For uncomplicated UTI caused by E. coli, K. pneumoniae or P. mirabilis: Cefazolin is susceptible if MIC <32 mcg/mL and predicts susceptible to the  oral agents cefaclor, cefdinir, cefpodoxime, cefprozil, cefuroxime, cephalexin and loracarbef. Legend: S = Susceptible  I = Intermediate R = Resistant  NS = Not susceptible * = Not tested  NR = Not reported **NN = See antimicrobic comments    Proteus mirabilis - URINE CULTURE, REFLEX    AMOX/CLAVULANIC <=2 Sensitive     AMPICILLIN <=2 Sensitive     AMPICILLIN/SULBACTAM <=2 Sensitive     CEFAZOLIN* <=4 Not Reportable      * For infections other than uncomplicated UTI caused by E. coli, K. pneumoniae or P. mirabilis: Cefazolin is resistant if MIC > or = 8 mcg/mL. (Distinguishing susceptible versus intermediate for isolates with MIC < or = 4 mcg/mL requires additional testing.) For uncomplicated UTI caused by E. coli, K. pneumoniae or P. mirabilis: Cefazolin is susceptible if MIC <32 mcg/mL and predicts susceptible to the oral agents cefaclor, cefdinir, cefpodoxime, cefprozil, cefuroxime, cephalexin and loracarbef.     CEFTAZIDIME <=1 Sensitive     CEFEPIME <=1 Sensitive     CEFTRIAXONE <=1 Sensitive     CIPROFLOXACIN <=0.25 Sensitive     LEVOFLOXACIN <=0.12 Sensitive     GENTAMICIN <=1 Sensitive     IMIPENEM 2 Intermediate     NITROFURANTOIN 128 Resistant     PIP/TAZO <=4 Sensitive     TOBRAMYCIN <=1 Sensitive     TRIMETH/SULFA <=20 Sensitive   POCT Glucose (CBG)  Result Value Ref Range   POC Glucose 85 70 - 99 mg/dl  POCT Urinalysis Dipstick  Result Value Ref Range   Color, UA Yellow    Clarity, UA clear    Glucose, UA Positive (A) Negative   Bilirubin, UA negative    Ketones, UA negative    Spec Grav, UA >=1.030 (A) 1.010 - 1.025   Blood, UA negative    pH, UA 6.0 5.0 - 8.0   Protein, UA Negative Negative   Urobilinogen, UA 0.2 0.2 or 1.0 E.U./dL   Nitrite, UA negative    Leukocytes, UA Trace (A) Negative   Appearance     Odor          Assessment & Plan:   Problem List Items Addressed This Visit   None Visit Diagnoses     Acute cystitis without  hematuria    -  Primary   Relevant Medications   sulfamethoxazole-trimethoprim (BACTRIM DS) 800-160 MG tablet   Other Relevant Orders   Urine Culture (Completed)   POCT Urinalysis Dipstick (Completed)   Glucosuria with normal serum glucose       Relevant Orders   POCT Glucose (CBG) (Completed)      Meds ordered this encounter  Medications   DISCONTD: sulfamethoxazole-trimethoprim (BACTRIM DS) 800-160 MG tablet    Sig: Take 1 tablet by mouth 2 (two) times daily.    Dispense:  6 tablet  Refill:  0    Order Specific Question:   Supervising Provider    Answer:   Nadene Rubins ALFRED [[id number]]   sulfamethoxazole-trimethoprim (BACTRIM DS) 800-160 MG tablet    Sig: Take 1 tablet by mouth 2 (two) times daily.    Dispense:  14 tablet    Refill:  0    Order Specific Question:   Supervising Provider    Answer:   Mliss Sax [[id number]]   Return in about 4 weeks (around 03/09/2023) for CPE (fasting) with PAP.    Alysia Penna, NP

## 2023-02-09 NOTE — Patient Instructions (Signed)
Maintain adequate oral hydration Start bactrim, take with food Urine sent for culture

## 2023-02-12 LAB — URINE CULTURE
MICRO NUMBER:: 14868114
SPECIMEN QUALITY:: ADEQUATE

## 2023-02-14 ENCOUNTER — Telehealth: Payer: Self-pay | Admitting: Nurse Practitioner

## 2023-02-14 DIAGNOSIS — B9620 Unspecified Escherichia coli [E. coli] as the cause of diseases classified elsewhere: Secondary | ICD-10-CM

## 2023-02-14 DIAGNOSIS — R300 Dysuria: Secondary | ICD-10-CM

## 2023-02-14 MED ORDER — SULFAMETHOXAZOLE-TRIMETHOPRIM 800-160 MG PO TABS
1.0000 | ORAL_TABLET | Freq: Two times a day (BID) | ORAL | 0 refills | Status: DC
Start: 2023-02-14 — End: 2023-02-26

## 2023-02-14 NOTE — Telephone Encounter (Signed)
Pt would like a call to hear her lab results from last week.

## 2023-02-14 NOTE — Telephone Encounter (Signed)
Called patient back and advised her of the Urine culture results

## 2023-02-14 NOTE — Addendum Note (Signed)
Addended by: Michaela Corner on: 02/14/2023 02:49 PM   Modules accepted: Orders

## 2023-02-14 NOTE — Progress Notes (Signed)
Positive bacteria growth: both bacteria are sensitive to bactrim. Additional tabs sent

## 2023-02-21 NOTE — Telephone Encounter (Signed)
Pt was seen recently for a poss uti with Mallory Freeman. she says she is still going to the bathroom frequently. Her urine is dark and cloudy and has a strong smell. Please advise pt at [phone]

## 2023-02-22 NOTE — Telephone Encounter (Signed)
Called pt back and advised her she will need to schedule an office visit.  Her appointment is 5/9 @ 10:20

## 2023-02-24 ENCOUNTER — Other Ambulatory Visit (HOSPITAL_COMMUNITY)
Admission: RE | Admit: 2023-02-24 | Discharge: 2023-02-24 | Disposition: A | Discharge status: Home / Self Care | Payer: BLUE CROSS/BLUE SHIELD | Source: Ambulatory Visit | Attending: Nurse Practitioner | Admitting: Nurse Practitioner

## 2023-02-24 ENCOUNTER — Other Ambulatory Visit: Payer: BLUE CROSS/BLUE SHIELD

## 2023-02-24 DIAGNOSIS — R300 Dysuria: Secondary | ICD-10-CM | POA: Diagnosis present

## 2023-02-24 NOTE — Progress Notes (Signed)
Urine collected. For urine and C&S. Dm/cma

## 2023-02-24 NOTE — Addendum Note (Signed)
Addended by: Alysia Penna L on: 02/24/2023 09:59 AM   Modules accepted: Orders

## 2023-02-25 LAB — URINE CYTOLOGY ANCILLARY ONLY
Chlamydia: NEGATIVE
Comment: NEGATIVE
Comment: NEGATIVE
Comment: NORMAL
Neisseria Gonorrhea: NEGATIVE
Trichomonas: NEGATIVE

## 2023-02-26 LAB — URINE CULTURE
MICRO NUMBER:: 14935630
SPECIMEN QUALITY:: ADEQUATE

## 2023-02-26 MED ORDER — NITROFURANTOIN MONOHYD MACRO 100 MG PO CAPS
100.0000 mg | ORAL_CAPSULE | Freq: Two times a day (BID) | ORAL | 0 refills | Status: DC
Start: 2023-02-26 — End: 2023-03-24

## 2023-02-26 NOTE — Addendum Note (Signed)
Addended by: Alysia Penna L on: 02/26/2023 02:51 PM   Modules accepted: Orders

## 2023-03-24 ENCOUNTER — Ambulatory Visit (INDEPENDENT_AMBULATORY_CARE_PROVIDER_SITE_OTHER): Payer: BLUE CROSS/BLUE SHIELD | Admitting: Nurse Practitioner

## 2023-03-24 ENCOUNTER — Encounter: Payer: Self-pay | Admitting: Nurse Practitioner

## 2023-03-24 VITALS — BP 106/66 | HR 76 | Temp 97.6°F | Ht 64.5 in | Wt 158.8 lb

## 2023-03-24 DIAGNOSIS — Z0000 Encounter for general adult medical examination without abnormal findings: Secondary | ICD-10-CM | POA: Diagnosis not present

## 2023-03-24 DIAGNOSIS — Z1159 Encounter for screening for other viral diseases: Secondary | ICD-10-CM

## 2023-03-24 NOTE — Patient Instructions (Signed)
Schedule fasting lab appt. Need to be fasting 8hrs prior to blood draw. Ok to drink water and take BP meds. Continue Heart healthy diet and daily exercise.  Preventive Care 72-28 Years Old, Female Preventive care refers to lifestyle choices and visits with your health care provider that can promote health and wellness. Preventive care visits are also called wellness exams. What can I expect for my preventive care visit? Counseling During your preventive care visit, your health care provider may ask about your: Medical history, including: Past medical problems. Family medical history. Pregnancy history. Current health, including: Menstrual cycle. Method of birth control. Emotional well-being. Home life and relationship well-being. Sexual activity and sexual health. Lifestyle, including: Alcohol, nicotine or tobacco, and drug use. Access to firearms. Diet, exercise, and sleep habits. Work and work Astronomer. Sunscreen use. Safety issues such as seatbelt and bike helmet use. Physical exam Your health care provider may check your: Height and weight. These may be used to calculate your BMI (body mass index). BMI is a measurement that tells if you are at a healthy weight. Waist circumference. This measures the distance around your waistline. This measurement also tells if you are at a healthy weight and may help predict your risk of certain diseases, such as type 2 diabetes and high blood pressure. Heart rate and blood pressure. Body temperature. Skin for abnormal spots. What immunizations do I need?  Vaccines are usually given at various ages, according to a schedule. Your health care provider will recommend vaccines for you based on your age, medical history, and lifestyle or other factors, such as travel or where you work. What tests do I need? Screening Your health care provider may recommend screening tests for certain conditions. This may include: Pelvic exam and Pap  test. Lipid and cholesterol levels. Diabetes screening. This is done by checking your blood sugar (glucose) after you have not eaten for a while (fasting). Hepatitis B test. Hepatitis C test. HIV (human immunodeficiency virus) test. STI (sexually transmitted infection) testing, if you are at risk. BRCA-related cancer screening. This may be done if you have a family history of breast, ovarian, tubal, or peritoneal cancers. Talk with your health care provider about your test results, treatment options, and if necessary, the need for more tests. Follow these instructions at home: Eating and drinking  Eat a healthy diet that includes fresh fruits and vegetables, whole grains, lean protein, and low-fat dairy products. Take vitamin and mineral supplements as recommended by your health care provider. Do not drink alcohol if: Your health care provider tells you not to drink. You are pregnant, may be pregnant, or are planning to become pregnant. If you drink alcohol: Limit how much you have to 0-1 drink a day. Know how much alcohol is in your drink. In the [place] one drink equals one 12 oz bottle of beer (355 mL), one 5 oz glass of wine (148 mL), or one 1 oz glass of hard liquor (44 mL). Lifestyle Brush your teeth every morning and night with fluoride toothpaste. Floss one time each day. Exercise for at least 30 minutes 5 or more days each week. Do not use any products that contain nicotine or tobacco. These products include cigarettes, chewing tobacco, and vaping devices, such as e-cigarettes. If you need help quitting, ask your health care provider. Do not use drugs. If you are sexually active, practice safe sex. Use a condom or other form of protection to prevent STIs. If you do not wish to become pregnant, use  a form of birth control. If you plan to become pregnant, see your health care provider for a prepregnancy visit. Find healthy ways to manage stress, such as: Meditation, yoga, or  listening to music. Journaling. Talking to a trusted person. Spending time with friends and family. Minimize exposure to UV radiation to reduce your risk of skin cancer. Safety Always wear your seat belt while driving or riding in a vehicle. Do not drive: If you have been drinking alcohol. Do not ride with someone who has been drinking. If you have been using any mind-altering substances or drugs. While texting. When you are tired or distracted. Wear a helmet and other protective equipment during sports activities. If you have firearms in your house, make sure you follow all gun safety procedures. Seek help if you have been physically or sexually abused. What's next? Go to your health care provider once a year for an annual wellness visit. Ask your health care provider how often you should have your eyes and teeth checked. Stay up to date on all vaccines. This information is not intended to replace advice given to you by your health care provider. Make sure you discuss any questions you have with your health care provider. Document Revised: 04/01/2021 Document Reviewed: 04/01/2021 Elsevier Patient Education  2024 [employer].

## 2023-03-24 NOTE — Progress Notes (Signed)
Complete physical exam  Patient: Mallory Freeman   DOB: Sep 13, 1996   26 y.o. Female  MRN: [id number] Visit Date: 03/24/2023  Subjective:    Chief Complaint  Patient presents with   Annual Exam   Mallory Freeman is a 27 y.o. female who presents today for a complete physical exam. She reports consuming a general diet.  Exercise 3-4xweek, weight training and cardio  She generally feels well. She reports sleeping well. She does not have additional problems to discuss today.  Vision:Yes Dental:Yes STD Screen:Yes, request for only HSV serum test PAP completed by D. Henreitta Leber per patient, report requested  BP Readings from Last 3 Encounters:  03/24/23 106/66  02/09/23 110/79  04/22/22 116/78   Wt Readings from Last 3 Encounters:  03/24/23 158 lb 12.8 oz (72 kg)  02/09/23 154 lb (69.9 kg)  04/22/22 156 lb 3.2 oz (70.9 kg)   Most recent fall risk assessment:    11/12/2021    9:43 AM  Fall Risk   Falls in the past year? 0  Number falls in past yr: 0  Injury with Fall? 0   Depression screen:Yes - No Depression  Most recent depression screenings:    03/24/2023    8:44 AM 11/26/2021    8:40 AM  PHQ 2/9 Scores  PHQ - 2 Score 0 4  PHQ- 9 Score  17   HPI  No problem-specific Assessment & Plan notes found for this encounter.  Past Medical History:  Diagnosis Date   Anemia    Asthma    GAD (generalized anxiety disorder)    Genital warts    HSV infection    History reviewed. No pertinent surgical history. Social History   Socioeconomic History   Marital status: Single    Spouse name: Not on file   Number of children: Not on file   Years of education: Not on file   Highest education level: Not on file  Occupational History   Not on file  Tobacco Use   Smoking status: Never   Smokeless tobacco: Never  Vaping Use   Vaping Use: Never used  Substance and Sexual Activity   Alcohol use: Yes    Comment: socially   Drug use: Yes    Types: Marijuana   Sexual  activity: Never    Birth control/protection: Abstinence  Other Topics Concern   Not on file  Social History Narrative   Work or School: finishing speech/lenguage therapy degree in 2020 - 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, works with children      Home Situation:      Spiritual Beliefs:      Lifestyle:   Social Determinants of Corporate investment banker Strain: Not on file  Food Insecurity: Not on file  Transportation Needs: Not on file  Physical Activity: Not on file  Stress: Not on file  Social Connections: Not on file  Intimate Partner Violence: Not on file   Family Status  Relation Name Status   Mother  Alive   Father  Alive   MGM  (Not Specified)   MGF  (Not Specified)   Other  (Not Specified)   Family History  Problem Relation Age of Onset   Hypertension Mother    Cancer Maternal Grandmother        lymphoma   Cancer Maternal Grandfather        prostate   Diabetes Maternal Grandfather    Breast cancer Other        Maternal  Great Aunts and GreatGreat GM   No Known Allergies  Patient Care Team: Langford Carias, Bonna Gains, NP as PCP - General (Internal Medicine)   Medications: Outpatient Medications Prior to Visit  Medication Sig   [DISCONTINUED] nitrofurantoin, macrocrystal-monohydrate, (MACROBID) 100 MG capsule Take 1 capsule (100 mg total) by mouth 2 (two) times daily.   No facility-administered medications prior to visit.    Review of Systems  Constitutional:  Negative for activity change, appetite change and unexpected weight change.  Respiratory: Negative.    Cardiovascular: Negative.   Gastrointestinal: Negative.   Endocrine: Negative for cold intolerance and heat intolerance.  Genitourinary: Negative.   Musculoskeletal: Negative.   Skin: Negative.   Neurological: Negative.   Hematological: Negative.   Psychiatric/Behavioral:  Negative for behavioral problems, decreased concentration, dysphoric mood, hallucinations, self-injury, sleep disturbance and suicidal  ideas. The patient is not nervous/anxious.         Objective:  BP 106/66 (BP Location: Left Arm, Patient Position: Sitting, Cuff Size: Normal)   Pulse 76   Temp 97.6 F (36.4 C) (Temporal)   Ht 5' 4.5" (1.638 m)   Wt 158 lb 12.8 oz (72 kg)   SpO2 98%   BMI 26.84 kg/m     Physical Exam Vitals and nursing note reviewed.  Constitutional:      General: She is not in acute distress. HENT:     Right Ear: Tympanic membrane, ear canal and external ear normal.     Left Ear: Tympanic membrane, ear canal and external ear normal.     Nose: Nose normal.  Eyes:     Extraocular Movements: Extraocular movements intact.     Conjunctiva/sclera: Conjunctivae normal.     Pupils: Pupils are equal, round, and reactive to light.  Neck:     Thyroid: No thyroid mass, thyromegaly or thyroid tenderness.  Cardiovascular:     Rate and Rhythm: Normal rate and regular rhythm.     Pulses: Normal pulses.     Heart sounds: Normal heart sounds.  Pulmonary:     Effort: Pulmonary effort is normal.     Breath sounds: Normal breath sounds.  Abdominal:     General: Bowel sounds are normal.     Palpations: Abdomen is soft.  Genitourinary:    Comments: Deferred to GYN Musculoskeletal:        General: Normal range of motion.     Cervical back: Normal range of motion and neck supple.     Right lower leg: No edema.     Left lower leg: No edema.  Lymphadenopathy:     Cervical: No cervical adenopathy.  Skin:    General: Skin is warm and dry.  Neurological:     Mental Status: She is alert and oriented to person, place, and time.     Cranial Nerves: No cranial nerve deficit.  Psychiatric:        Mood and Affect: Mood normal.        Behavior: Behavior normal.        Thought Content: Thought content normal.     No results found for any visits on 03/24/23.    Assessment & Plan:    Routine Health Maintenance and Physical Exam  Immunization History  Administered Date(s) Administered   DTaP 07/28/1995,  09/27/1995, 12/16/1995, 08/29/1996   HIB (PRP-OMP) 07/28/1995, 09/27/1995, 12/16/1995, 08/29/1996   Hepatitis B 01/29/95, 07/28/1995, 02/14/1996   IPV 07/28/1995, 09/27/1995, 12/16/1995   Influenza,inj,Quad PF,6+ Mos 07/15/2018   MMR 05/16/1996   PFIZER(Purple Top)SARS-COV-2 Vaccination 12/16/2019,  01/07/2020   Td 03/26/2013   Tdap 03/26/2013   Health Maintenance  Topic Date Due   PAP-Cervical Cytology Screening  Never done   PAP SMEAR-Modifier  10/22/2019   COVID-19 Vaccine (3 - 2023-24 season) 04/09/2023 (Originally 06/18/2022)   Hepatitis C Screening  03/23/2024 (Originally 05/14/2013)   HIV Screening  03/23/2024 (Originally 05/14/2010)   DTaP/Tdap/Td (7 - Td or Tdap) 03/27/2023   INFLUENZA VACCINE  05/19/2023   HPV VACCINES  Aged Out   Discussed health benefits of physical activity, and encouraged her to engage in regular exercise appropriate for her age and condition.  Problem List Items Addressed This Visit   None Visit Diagnoses     Preventative health care    -  Primary   Relevant Orders   CBC   Comprehensive metabolic panel   Encounter for screening for viral disease       Relevant Orders   HSV 1 and 2 Ab, IgG      Return in about 1 year (around 03/23/2024) for CPE (fasting).     Alysia Penna, NP

## 2023-03-25 ENCOUNTER — Other Ambulatory Visit (INDEPENDENT_AMBULATORY_CARE_PROVIDER_SITE_OTHER): Payer: BLUE CROSS/BLUE SHIELD

## 2023-03-25 DIAGNOSIS — Z0000 Encounter for general adult medical examination without abnormal findings: Secondary | ICD-10-CM | POA: Diagnosis not present

## 2023-03-25 DIAGNOSIS — Z1159 Encounter for screening for other viral diseases: Secondary | ICD-10-CM

## 2023-03-25 LAB — COMPREHENSIVE METABOLIC PANEL
ALT: 15 U/L (ref 0–35)
AST: 16 U/L (ref 0–37)
Albumin: 4.3 g/dL (ref 3.5–5.2)
Alkaline Phosphatase: 38 U/L — ABNORMAL LOW (ref 39–117)
BUN: 7 mg/dL (ref 6–23)
CO2: 28 mEq/L (ref 19–32)
Calcium: 9.5 mg/dL (ref 8.4–10.5)
Chloride: 102 mEq/L (ref 96–112)
Creatinine, Ser: 0.8 mg/dL (ref 0.40–1.20)
GFR: 100.67 mL/min (ref >60.00)
Glucose, Bld: 96 mg/dL (ref 70–99)
Potassium: 4.1 mEq/L (ref 3.5–5.1)
Sodium: 138 mEq/L (ref 135–145)
Total Bilirubin: 0.4 mg/dL (ref 0.2–1.2)
Total Protein: 7.2 g/dL (ref 6.0–8.3)

## 2023-03-25 LAB — CBC
HCT: 38.6 % (ref 36.0–46.0)
Hemoglobin: 12.9 g/dL (ref 12.0–15.0)
MCHC: 33.4 g/dL (ref 30.0–36.0)
MCV: 98.4 fl (ref 78.0–100.0)
Platelets: 257 10*3/uL (ref 150.0–400.0)
RBC: 3.92 Mil/uL (ref 3.87–5.11)
RDW: 12.1 % (ref 11.5–15.5)
WBC: 4.8 10*3/uL (ref 4.0–10.5)

## 2023-03-25 NOTE — Progress Notes (Signed)
Stable Follow instructions as discussed during office visit.

## 2023-03-26 LAB — HSV 1 AND 2 AB, IGG
HSV 1 Glycoprotein G Ab, IgG: 5.06 index — ABNORMAL HIGH (ref 0.00–0.90)
HSV 2 IgG, Type Spec: <0.91 index (ref 0.00–0.90)

## 2023-03-29 ENCOUNTER — Encounter: Payer: Self-pay | Admitting: Nurse Practitioner

## 2023-04-01 ENCOUNTER — Other Ambulatory Visit: Payer: BLUE CROSS/BLUE SHIELD

## 2023-04-01 DIAGNOSIS — N390 Urinary tract infection, site not specified: Secondary | ICD-10-CM

## 2023-04-02 LAB — CULTURE INDICATED

## 2023-04-02 LAB — URINALYSIS W MICROSCOPIC + REFLEX CULTURE
Protein, ur: NEGATIVE
Specific Gravity, Urine: 1.019 (ref 1.001–1.035)
pH: 6.5 (ref 5.0–8.0)

## 2023-04-05 LAB — URINALYSIS W MICROSCOPIC + REFLEX CULTURE
Bilirubin Urine: NEGATIVE
Glucose, UA: NEGATIVE
Hgb urine dipstick: NEGATIVE
Hyaline Cast: NONE SEEN /LPF
Ketones, ur: NEGATIVE
Nitrites, Initial: NEGATIVE
RBC / HPF: NONE SEEN /HPF (ref 0–2)

## 2023-04-05 LAB — URINE CULTURE

## 2023-04-06 ENCOUNTER — Encounter: Payer: Self-pay | Admitting: Nurse Practitioner

## 2023-04-07 ENCOUNTER — Other Ambulatory Visit: Payer: Self-pay

## 2023-04-07 DIAGNOSIS — Z1159 Encounter for screening for other viral diseases: Secondary | ICD-10-CM

## 2023-04-07 DIAGNOSIS — Z0000 Encounter for general adult medical examination without abnormal findings: Secondary | ICD-10-CM

## 2023-04-19 ENCOUNTER — Telehealth: Payer: Self-pay | Admitting: Nurse Practitioner

## 2023-04-19 NOTE — Telephone Encounter (Signed)
Called patient and made aware of Padonda's instruction

## 2023-04-19 NOTE — Telephone Encounter (Signed)
Pt has moved and she is suppose to come in for a repeat urine sample. The first one was canceled by the lab. She is wondering what step she should take from here. Please advise pt at [phone]

## 2023-09-27 ENCOUNTER — Encounter: Payer: Self-pay | Admitting: Nurse Practitioner

## 2023-09-27 ENCOUNTER — Telehealth: Payer: Self-pay | Admitting: Nurse Practitioner

## 2023-09-27 ENCOUNTER — Telehealth (INDEPENDENT_AMBULATORY_CARE_PROVIDER_SITE_OTHER): Payer: BLUE CROSS/BLUE SHIELD | Admitting: Nurse Practitioner

## 2023-09-27 ENCOUNTER — Other Ambulatory Visit: Payer: Self-pay | Admitting: Nurse Practitioner

## 2023-09-27 DIAGNOSIS — J45990 Exercise induced bronchospasm: Secondary | ICD-10-CM | POA: Insufficient documentation

## 2023-09-27 DIAGNOSIS — J00 Acute nasopharyngitis [common cold]: Secondary | ICD-10-CM | POA: Diagnosis not present

## 2023-09-27 MED ORDER — BENZONATATE 200 MG PO CAPS
200.0000 mg | ORAL_CAPSULE | Freq: Three times a day (TID) | ORAL | 0 refills | Status: DC | PRN
Start: 2023-09-27 — End: 2024-07-30

## 2023-09-27 MED ORDER — FLUTICASONE PROPIONATE 50 MCG/ACT NA SUSP
2.0000 | Freq: Every day | NASAL | 0 refills | Status: DC
Start: 2023-09-27 — End: 2023-10-20

## 2023-09-27 MED ORDER — ALBUTEROL SULFATE HFA 108 (90 BASE) MCG/ACT IN AERS
1.0000 | INHALATION_SPRAY | Freq: Four times a day (QID) | RESPIRATORY_TRACT | 1 refills | Status: AC | PRN
Start: 2023-09-27 — End: ?

## 2023-09-27 NOTE — Assessment & Plan Note (Signed)
Diagnosed at young age Triggers: exercise and viral URI. Symptoms are cough and wheezing Previous use of ICS-advair, but has not need this for several years Last albuterol rx filled in 2022.  Current exacerbation due to scute URI. Albuterol and benzonatate sent Advised to also use mucinex as needed

## 2023-09-27 NOTE — Telephone Encounter (Signed)
I called the pt today @ 11:00 to get pt transferred to nurse triage . Pt said she is ok right now. Pt stated she really just need a new inhaler because her dog chewed her inhaler . Pt said she need it because late nights and early morning hard for her to breath.. pt denied talking to nurse access

## 2023-09-27 NOTE — Patient Instructions (Addendum)
URI Instructions: Flonase and Afrin use: apply 1spray of afrin in each nare, wait , then apply 2sprays of flonase in each nare. Use both nasal spray consecutively x 3days, then flonase only for at least 14days.  Encourage adequate oral hydration. Use over-the-counter  cold" medicine  such as Dayquil/nyquil for cough and congestion.  Use mucinex DM or Robitussin  or delsym for cough without congestion  You can use plain "Tylenol" or "Advil" for fever, chills and achyness. Use cool mist humidifier at bedtime to help with nasal congestion and cough.  Cold/cough medications may have tylenol or ibuprofen or guaifenesin or dextromethophan in them, so be careful not to take beyond the recommended dose for each of these medications.   "Common cold" symptoms are usually triggered by a virus.  The antibiotics are usually not necessary. On average, a" viral cold" illness may take 7-10 days to resolve. Please, make an appointment if you are not better or if you're worse.

## 2023-09-27 NOTE — Progress Notes (Signed)
Virtual Visit via Video Note  I connected withNAME@ on 09/27/23 at  1:40 PM EST by a video enabled telemedicine application and verified that I am speaking with the correct person using two identifiers.  Location: Patient:Home Provider: Office Participants: patient and provider  I discussed the limitations of evaluation and management by telemedicine and the availability of in person appointments. I also discussed with the patient that there may be a patient responsible charge related to this service. The patient expressed understanding and agreed to proceed.  HQ:IONGE and sinus congestion x 4days  History of Present Illness:  URI  This is a new problem. The current episode started in the past 7 days. The problem has been unchanged. There has been no fever. Associated symptoms include congestion, coughing, rhinorrhea, sinus pain and wheezing. Pertinent negatives include no abdominal pain, chest pain, diarrhea, dysuria, ear pain, headaches, joint pain, joint swelling, nausea, neck pain, plugged ear sensation, rash, sneezing, sore throat, swollen glands or vomiting. She has tried acetaminophen for the symptoms. The treatment provided mild relief.   Cough is worse at bedtime.  Exercise-induced asthma with acute exacerbation Diagnosed at young age Triggers: exercise and viral URI. Symptoms are cough and wheezing Previous use of ICS-advair, but has not need this for several years Last albuterol rx filled in 2022.  Current exacerbation due to scute URI. Albuterol and benzonatate sent Advised to also use mucinex as needed  Observations/Objective: Physical Exam Nursing note reviewed.  Constitutional:      General: She is not in acute distress. Pulmonary:     Effort: Pulmonary effort is normal.  Neurological:     Mental Status: She is alert and oriented to person, place, and time.     Assessment and Plan: Mallory Freeman was seen today for acute visit.  Diagnoses and all orders for this  visit:  Exercise-induced asthma with acute exacerbation -     albuterol (VENTOLIN HFA) 108 (90 Base) MCG/ACT inhaler; Inhale 1-2 puffs into the lungs every 6 (six) hours as needed.  Acute nasopharyngitis -     benzonatate (TESSALON) 200 MG capsule; Take 1 capsule (200 mg total) by mouth 3 (three) times daily as needed. -     fluticasone (FLONASE) 50 MCG/ACT nasal spray; Place 2 sprays into both nostrils daily.   Follow Up Instructions: Flonase and Afrin use: apply 1spray of afrin in each nare, wait , then apply 2sprays of flonase in each nare. Use both nasal spray consecutively x 3days, then flonase only for at least 14days.  Encourage adequate oral hydration. Use over-the-counter  cold" medicine  such as Dayquil/nyquil for cough and congestion.  Use mucinex DM or Robitussin  or delsym for cough without congestion  You can use plain "Tylenol" or "Advil" for fever, chills and achyness. Use cool mist humidifier at bedtime to help with nasal congestion and cough.  Cold/cough medications may have tylenol or ibuprofen or guaifenesin or dextromethophan in them, so be careful not to take beyond the recommended dose for each of these medications.   I discussed the assessment and treatment plan with the patient. The patient was provided an opportunity to ask questions and all were answered. The patient agreed with the plan and demonstrated an understanding of the instructions.   The patient was advised to call back or seek an in-person evaluation if the symptoms worsen or if the condition fails to improve as anticipated.  Alysia Penna, NP

## 2023-10-20 ENCOUNTER — Other Ambulatory Visit: Payer: Self-pay | Admitting: Nurse Practitioner

## 2023-10-20 DIAGNOSIS — J00 Acute nasopharyngitis [common cold]: Secondary | ICD-10-CM

## 2024-03-26 ENCOUNTER — Encounter: Payer: BLUE CROSS/BLUE SHIELD | Admitting: Nurse Practitioner

## 2024-04-03 ENCOUNTER — Encounter: Payer: BLUE CROSS/BLUE SHIELD | Admitting: Nurse Practitioner

## 2024-05-04 ENCOUNTER — Encounter: Payer: Self-pay | Admitting: Advanced Practice Midwife

## 2024-07-30 ENCOUNTER — Ambulatory Visit (INDEPENDENT_AMBULATORY_CARE_PROVIDER_SITE_OTHER): Admitting: Nurse Practitioner

## 2024-07-30 ENCOUNTER — Encounter: Payer: Self-pay | Admitting: Nurse Practitioner

## 2024-07-30 VITALS — BP 112/68 | HR 82 | Ht 64.0 in | Wt 159.0 lb

## 2024-07-30 DIAGNOSIS — Z136 Encounter for screening for cardiovascular disorders: Secondary | ICD-10-CM | POA: Diagnosis not present

## 2024-07-30 DIAGNOSIS — E663 Overweight: Secondary | ICD-10-CM | POA: Diagnosis not present

## 2024-07-30 DIAGNOSIS — Z13220 Encounter for screening for lipoid disorders: Secondary | ICD-10-CM | POA: Diagnosis not present

## 2024-07-30 DIAGNOSIS — M2042 Other hammer toe(s) (acquired), left foot: Secondary | ICD-10-CM | POA: Insufficient documentation

## 2024-07-30 DIAGNOSIS — Z833 Family history of diabetes mellitus: Secondary | ICD-10-CM

## 2024-07-30 NOTE — Assessment & Plan Note (Addendum)
 Lowest weight:150lbs  in 2018 Highest weight: 170lbs 57yrs ago She implemented lifestyle modifications in last 1year with minimal weight loss noted. Exercise: 4-5x/week, cardio-13mins, weight training-67mins Diet: low sugar, lean protein, low carb, high fiber with fruit and vegetables (unknown daily caloric intake) ALCOHOL-every other week, 2shots of liquor No caffeine, no sugary drinks No toballo use No illicit drug use Sleep: 8:30pm to 5:30pm, feels rested Wt Readings from Last 3 Encounters:  07/30/24 159 lb (72.1 kg)  03/24/23 158 lb 12.8 oz (72 kg)  02/09/23 154 lb (69.9 kg)    Advised to use myfitnesspal phone ap to count daily caloric intake. Provided printed information of recommended serving size for each food group. She requested referral to nutritionist- order entered. Advised to use the 3D fitness scan at [facility] to determine lean vs fat mass vs water weight. Check tsh, hgbA1c, lipid panel, and CMP F/up prn

## 2024-07-30 NOTE — Patient Instructions (Signed)
 Schedule fasting lab appt. Need to be fasting 8hrs prior to blood draw. Ok to drink water Maintain Heart healthy diet and daily exercise.  Use myfitnesspal to count daily caloric intake Use biometric scan to determine lean muscle vs fat vs water weight

## 2024-07-30 NOTE — Progress Notes (Signed)
 Established Patient Visit  Patient: Mallory Freeman   DOB: [birth year]   28 y.o. Female  MRN: [id number] Visit Date: 07/30/2024  Subjective:    Chief Complaint  Patient presents with   Weight management     Discuss options for weight loss    HPI Overweight (BMI 25.0-29.9) Lowest weight:150lbs  in 2018 Highest weight: 170lbs 64yrs ago She implemented lifestyle modifications in last 1year with minimal weight loss noted. Exercise: 4-5x/week, cardio-73mins, weight training-74mins Diet: low sugar, lean protein, low carb, high fiber with fruit and vegetables (unknown daily caloric intake) ALCOHOL-every other week, 2shots of liquor No caffeine, no sugary drinks No toballo use No illicit drug use Sleep: 8:30pm to 5:30pm, feels rested Wt Readings from Last 3 Encounters:  07/30/24 159 lb (72.1 kg)  03/24/23 158 lb 12.8 oz (72 kg)  02/09/23 154 lb (69.9 kg)    Advised to use myfitnesspal phone ap to count daily caloric intake. Provided printed information of recommended serving size for each food group. She requested referral to nutritionist- order entered. Advised to use the 3D fitness scan at [facility] to determine lean vs fat mass vs water weight. Check tsh, hgbA1c, lipid panel, and CMP F/up prn  Reviewed medical, surgical, and social history today  Medications: Outpatient Medications Prior to Visit  Medication Sig   albuterol  (VENTOLIN  HFA) 108 (90 Base) MCG/ACT inhaler Inhale 1-2 puffs into the lungs every 6 (six) hours as needed.   fluticasone  (FLONASE ) 50 MCG/ACT nasal spray SPRAY 2 SPRAYS INTO EACH NOSTRIL EVERY DAY (Patient taking differently: Place 2 sprays into both nostrils as needed for allergies or rhinitis.)   [DISCONTINUED] benzonatate  (TESSALON ) 200 MG capsule Take 1 capsule (200 mg total) by mouth 3 (three) times daily as needed. (Patient not taking: Reported on 07/30/2024)   No facility-administered medications prior to visit.   Reviewed past medical  and social history.   ROS per HPI above      Objective:  BP 112/68 (BP Location: Left Arm, Patient Position: Sitting, Cuff Size: Normal)   Pulse 82   Ht 5' 4 (1.626 m)   Wt 159 lb (72.1 kg)   LMP 07/28/2024   SpO2 100%   BMI 27.29 kg/m      Physical Exam Vitals and nursing note reviewed.  Neck:     Thyroid : No thyroid  mass, thyromegaly or thyroid  tenderness.  Cardiovascular:     Rate and Rhythm: Normal rate.     Pulses: Normal pulses.  Pulmonary:     Effort: Pulmonary effort is normal.  Musculoskeletal:     Cervical back: Normal range of motion and neck supple.  Neurological:     Mental Status: She is alert and oriented to person, place, and time.     No results found for any visits on 07/30/24.    Assessment & Plan:    Problem List Items Addressed This Visit     Overweight (BMI 25.0-29.9) - Primary   Lowest weight:150lbs  in 2018 Highest weight: 170lbs 17yrs ago She implemented lifestyle modifications in last 1year with minimal weight loss noted. Exercise: 4-5x/week, cardio-37mins, weight training-38mins Diet: low sugar, lean protein, low carb, high fiber with fruit and vegetables (unknown daily caloric intake) ALCOHOL-every other week, 2shots of liquor No caffeine, no sugary drinks No toballo use No illicit drug use Sleep: 8:30pm to 5:30pm, feels rested Wt Readings from Last 3 Encounters:  07/30/24 159 lb (72.1 kg)  03/24/23 158 lb 12.8 oz (72 kg)  02/09/23 154 lb (69.9 kg)    Advised to use myfitnesspal phone ap to count daily caloric intake. Provided printed information of recommended serving size for each food group. She requested referral to nutritionist- order entered. Advised to use the [facility]D fitness scan at [facility] to determine lean vs fat mass vs water weight. Check tsh, hgbA1c, lipid panel, and CMP F/up prn      Relevant Orders   Referral to Nutrition and Diabetes Services   TSH   Comprehensive metabolic panel with GFR   Lipid panel    Hemoglobin A1c   Other Visit Diagnoses       Encounter for lipid screening for cardiovascular disease       Relevant Orders   Lipid panel     Family history of diabetes mellitus (DM)       Relevant Orders   Hemoglobin A1c      Return if symptoms worsen or fail to improve.     Roselie Mood, NP

## 2024-08-10 ENCOUNTER — Other Ambulatory Visit (INDEPENDENT_AMBULATORY_CARE_PROVIDER_SITE_OTHER)

## 2024-08-10 DIAGNOSIS — Z13220 Encounter for screening for lipoid disorders: Secondary | ICD-10-CM

## 2024-08-10 DIAGNOSIS — Z136 Encounter for screening for cardiovascular disorders: Secondary | ICD-10-CM

## 2024-08-10 DIAGNOSIS — E663 Overweight: Secondary | ICD-10-CM

## 2024-08-10 DIAGNOSIS — Z833 Family history of diabetes mellitus: Secondary | ICD-10-CM | POA: Diagnosis not present

## 2024-08-10 LAB — COMPREHENSIVE METABOLIC PANEL WITH GFR
ALT: 11 U/L (ref 0–35)
AST: 15 U/L (ref 0–37)
Albumin: 4.5 g/dL (ref 3.5–5.2)
Alkaline Phosphatase: 41 U/L (ref 39–117)
BUN: 10 mg/dL (ref 6–23)
CO2: 22 meq/L (ref 19–32)
Calcium: 9.4 mg/dL (ref 8.4–10.5)
Chloride: 103 meq/L (ref 96–112)
Creatinine, Ser: 0.83 mg/dL (ref 0.40–1.20)
GFR: 95.39 mL/min (ref >60.00)
Glucose, Bld: 87 mg/dL (ref 70–99)
Potassium: 3.9 meq/L (ref 3.5–5.1)
Sodium: 137 meq/L (ref 135–145)
Total Bilirubin: 0.5 mg/dL (ref 0.2–1.2)
Total Protein: 7.6 g/dL (ref 6.0–8.3)

## 2024-08-10 LAB — LIPID PANEL
Cholesterol: 172 mg/dL (ref 0–200)
HDL: 58.3 mg/dL (ref >39.00)
LDL Cholesterol: 102 mg/dL — ABNORMAL HIGH (ref 0–99)
NonHDL: 113.31
Total CHOL/HDL Ratio: 3
Triglycerides: 58 mg/dL (ref 0.0–149.0)
VLDL: 11.6 mg/dL (ref 0.0–40.0)

## 2024-08-10 LAB — HEMOGLOBIN A1C: Hgb A1c MFr Bld: 5.5 % (ref 4.6–6.5)

## 2024-08-10 LAB — TSH: TSH: 2.45 u[IU]/mL (ref 0.35–5.50)

## 2024-08-12 ENCOUNTER — Ambulatory Visit: Payer: Self-pay | Admitting: Nurse Practitioner

## 2024-09-10 ENCOUNTER — Ambulatory Visit: Admitting: Dietician

## 2024-10-17 ENCOUNTER — Encounter: Payer: Self-pay | Admitting: Dietician

## 2024-10-17 ENCOUNTER — Encounter: Attending: Nurse Practitioner | Admitting: Dietician

## 2024-10-17 DIAGNOSIS — E663 Overweight: Secondary | ICD-10-CM | POA: Diagnosis present

## 2024-10-17 NOTE — Progress Notes (Signed)
 Medical Nutrition Therapy  Appointment Start time:  [id number]  Appointment End time:  1000  Primary concerns today: plateau in weight loss, chronic disease prevention  Referral diagnosis: E66.3 Preferred learning style: no preference indicated Learning readiness: ready   NUTRITION ASSESSMENT   Anthropometrics   Wt 10/17/24: declined  Clinical Medical Hx: reviewed; anemia, anxiety, asthma Medications: reviewed Labs: reviewed Notable Signs/Symptoms: none reported Food Allergies: none  Lifestyle & Dietary Hx  Pt reports she has been working toward weight loss but is finding it difficult to lose. Pt reports she has been going to the gym 3-5 times per week, focusing on packing lunch and doing meal prep for dinners, and avoiding pork and beef. Pt reports she feels she has sweet cravings in the evening which may be impacting her from meeting her goals.   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   Pt reports she typically has breakfast from home or chickfila bowl, packs lunch, packs fruit for snacks, and meal preps dinners on Sunday (protein, starch, veggies).   Pt reports she has chronic diseases that run in her family that she wants to work toward preventing for herself.   Estimated daily fluid intake: 80 oz Supplements: ashwaganda, ACV, pre/probiotics Sleep: 7-8 hours Stress / self-care: mild stress Current average weekly physical activity: gym 3-5 days a week  24-Hr Dietary Recall First Meal: 7am: yogurt OR CFA breakfast bowl OR frozen turkey burrito OR oatmeal Snack: fruit Second Meal: salad OR chicken/tuna salad OR skips Snack: fruit Third Meal: ground turkey, sweet potato, broccoli Snack: 4-6 cookies OR drizzalicious snacks Beverages: water, occasional coffee,    NUTRITION DIAGNOSIS  NB-1.1 Food and nutrition-related knowledge deficit As related to lack of prior education by a registered  dietitian.  As evidenced by pt report.   NUTRITION INTERVENTION  Nutrition education (E-1) on the following topics:   Exercise Finding an exercise you enjoy is crucial for maintaining long-term fitness and overall health. Enjoyable activities are more likely to become regular habits, making it easier to stay consistent with physical activity. When you look forward to your workouts, exercise becomes a positive experience rather than a chore, reducing the likelihood of burnout or quitting. Enjoyable exercise also enhances mental well-being, as engaging in activities you love can boost mood, reduce stress, and provide a sense of accomplishment.  Body Acceptance Accepting your body as it is today is a crucial first step before starting any health journey. Embracing body acceptance fosters kindness and patience, helping you build a healthier, more sustainable relationship with food and yourself. Instead of focusing solely on the scale, tune into how your body feels and prioritize nourishing it with balanced meals that support your energy and wellbeing. Set goals that celebrate strength, stamina, and overall health rather than just weight. Remember, health is more than a number--it includes mental, emotional, and physical wellness. Treat yourself with compassion and celebrate every step of progress along the way.  Trying New Foods Trying a wide variety of vegetables is crucial not just for nutritional diversity but also for expanding your palate, though many people assume that one or two tries are enough, research suggests our tastes often need time to warm up to unfamiliar foods; we need around eight to ten neutral exposures to a new vegetable before acceptance increases, with some estimates reaching as many as twenty or more repetitions to shift perception.  Handouts Provided Include  Plate Method  Learning Style & Readiness for Change Teaching method utilized: Visual & Auditory  Demonstrated degree  of understanding via: Teach Back  Barriers to learning/adherence to lifestyle change: none  Goals Established by Pt  Pt declined need for goal setting.    MONITORING & EVALUATION Dietary intake, weekly physical activity, and follow up as needed.  Next Steps  Patient is to call for questions.
